# Patient Record
Sex: Male | Born: 1965 | Race: White | Hispanic: No | Marital: Married | Smoking: Never smoker
Health system: Southern US, Community
[De-identification: ages and names within clinical notes are randomized; demographics above are authoritative.]

## PROBLEM LIST (undated history)

## (undated) DIAGNOSIS — R040 Epistaxis: Secondary | ICD-10-CM

## (undated) DIAGNOSIS — Z87442 Personal history of urinary calculi: Secondary | ICD-10-CM

## (undated) DIAGNOSIS — N189 Chronic kidney disease, unspecified: Secondary | ICD-10-CM

## (undated) DIAGNOSIS — J3089 Other allergic rhinitis: Secondary | ICD-10-CM

## (undated) DIAGNOSIS — I10 Essential (primary) hypertension: Secondary | ICD-10-CM

## (undated) HISTORY — PX: APPENDECTOMY: SHX54

## (undated) HISTORY — PX: WISDOM TOOTH EXTRACTION: SHX21

---

## 2013-12-02 DIAGNOSIS — E785 Hyperlipidemia, unspecified: Secondary | ICD-10-CM | POA: Insufficient documentation

## 2013-12-02 DIAGNOSIS — Z8042 Family history of malignant neoplasm of prostate: Secondary | ICD-10-CM | POA: Insufficient documentation

## 2013-12-02 DIAGNOSIS — D229 Melanocytic nevi, unspecified: Secondary | ICD-10-CM | POA: Insufficient documentation

## 2014-12-29 DIAGNOSIS — R040 Epistaxis: Secondary | ICD-10-CM | POA: Insufficient documentation

## 2016-01-15 ENCOUNTER — Ambulatory Visit
Admission: EM | Admit: 2016-01-15 | Discharge: 2016-01-15 | Disposition: A | Discharge status: Home / Self Care | Payer: BC Managed Care – PPO | Attending: Emergency Medicine | Admitting: Emergency Medicine

## 2016-01-15 DIAGNOSIS — R319 Hematuria, unspecified: Secondary | ICD-10-CM

## 2016-01-15 DIAGNOSIS — J029 Acute pharyngitis, unspecified: Secondary | ICD-10-CM

## 2016-01-15 DIAGNOSIS — B349 Viral infection, unspecified: Secondary | ICD-10-CM | POA: Diagnosis not present

## 2016-01-15 DIAGNOSIS — R509 Fever, unspecified: Secondary | ICD-10-CM

## 2016-01-15 HISTORY — DX: Other allergic rhinitis: J30.89

## 2016-01-15 LAB — URINALYSIS COMPLETE WITH MICROSCOPIC (ARMC ONLY)
GLUCOSE, UA: NEGATIVE mg/dL
Ketones, ur: NEGATIVE mg/dL
LEUKOCYTES UA: NEGATIVE
Nitrite: NEGATIVE
PROTEIN: 30 mg/dL — AB
SPECIFIC GRAVITY, URINE: 1.025 (ref 1.005–1.030)
pH: 6 (ref 5.0–8.0)

## 2016-01-15 MED ORDER — SULFAMETHOXAZOLE-TRIMETHOPRIM 800-160 MG PO TABS: 1.0000 | ORAL_TABLET | Freq: Two times a day (BID) | ORAL | Status: DC

## 2016-01-15 NOTE — Discharge Instructions (Signed)
Push fluids, Pedialyte, Gatorade. Continue Goody's powders. Or You may take 1 g of Tylenol with 800 mg of ibuprofen up to 3 times a day. This is effective for fever and bodyaches. Don't do both Goody powder and Tylenol/ibuprofen mixture . Follow-up with primary care physician by next week to make sure that the blood in your urine has cleared. Go to the ER for the signs and symptoms we discussed

## 2016-01-15 NOTE — ED Provider Notes (Signed)
HPI  SUBJECTIVE:  Gregory Alexander is a 50 y.o. male who presents with fevers Tmax 102 for the past 2-3 days. He reports constant headaches, bodyaches, fatigue, decreased appetite. He reports a mild photophobia that has resolved with Goody's. He reports watery, nonbloody diarrhea 1-2 episodes per day, and dysuria. Symptoms are better with Goody's, which he states took 1 hour prior to arrival. No aggravating factors. He has also tried NyQuil and DayQuil. He denies nausea, vomiting, stiff neck, visual changes, rash, tick bite. No ear pain, nasal congestion, postnasal drip rhinorrhea, sinus pain or pressure, dental pain, sore throat, cough, wheeze, shortness of breath. No abdominal pain. No urinary urgency, frequency, cloudy or odorous urine, hematuria. No boils or skin infections. No aphasia, dysarthria, discoordination, arm or leg weakness. No raw uncooked foods, recent travel, questionable leftovers. He does state that he had this contact with similar symptoms. Patient states that he has not spent time outside in the woods recently. STDs are not a concern today. Patient states that overall he is getting better. Past medical history negative for STDs, UTI, pyelonephritis, diabetes, hypertension, immunocompromise, BPH, prostatitis, cancer, asthma, emphysema, COPD. He has a remote history of nephrolithiasis, hypercholesterolemia, headaches. He states that this is not the first or worst headache that he has ever had. PMD: [facility], [place]    Past Medical History  Diagnosis Date  . Environmental and seasonal allergies     Past Surgical History  Procedure Laterality Date  . Appendectomy      History reviewed. No pertinent family history.  Social History  Substance Use Topics  . Smoking status: Never Smoker   . Smokeless tobacco: None  . Alcohol Use: Yes     Comment: rare    No current facility-administered medications for this encounter.  Current outpatient prescriptions:  .   cetirizine (ZYRTEC) 10 MG chewable tablet, Chew 10 mg by mouth daily., Disp: , Rfl:  .  sulfamethoxazole-trimethoprim (BACTRIM DS,SEPTRA DS) 800-160 MG tablet, Take 1 tablet by mouth 2 (two) times daily., Disp: 6 tablet, Rfl: 0  No Known Allergies   ROS  As noted in HPI.   Physical Exam  BP 122/84 mmHg  Pulse 104  Temp(Src) 98.9 F (37.2 C) (Oral)  Resp 20  Ht 6\' 1"  (1.854 m)  Wt 230 lb (104.327 kg)  BMI 30.35 kg/m2  SpO2 96%  Constitutional: Well developed, well nourished, no acute distress Eyes: PERRL, EOMI, conjunctiva normal bilaterallyNo photophobia. HENT: Normocephalic, atraumatic,mucus membranes moist TMs normal. No nasal congestion. No sinus tenderness. Oropharynx normal. Neck: No cervical lymphadenopathy, meningismus Respiratory: Clear to auscultation bilaterally, no rales, no wheezing, no rhonchi Cardiovascular: Mild tachycardia, no murmurs, no gallops, no rubs GI: Soft, nondistended, normal bowel sounds, mild suprapubic tenderness no other abdominal tenderness, no rebound, no guarding Back: no CVAT skin: No rash on arms, hands, palms, torso, lower extremities, skin intact Musculoskeletal: No edema, no tenderness, no deformities Neurologic: Alert & oriented x 3, CN II-XII grossly intact, no motor deficits, sensation grossly intact. Speech fluent, gait normal. Psychiatric: Speech and behavior appropriate   ED Course   Medications - No data to display  Orders Placed This Encounter  Procedures  . Urine culture    Standing Status: Standing     Number of Occurrences: 1     Standing Expiration Date:     Order Specific Question:  Patient immune status    Answer:  Normal  . Urinalysis complete, with microscopic    Standing Status: Standing  Number of Occurrences: 1     Standing Expiration Date:    Results for orders placed or performed during the hospital encounter of 01/15/16 (from the past 24 hour(s))  Urinalysis complete, with microscopic     Status:  Abnormal   Collection Time: 01/15/16  2:18 PM  Result Value Ref Range   Color, Urine YELLOW YELLOW   APPearance CLEAR CLEAR   Glucose, UA NEGATIVE NEGATIVE mg/dL   Bilirubin Urine 1+ (A) NEGATIVE   Ketones, ur NEGATIVE NEGATIVE mg/dL   Specific Gravity, Urine 1.025 1.005 - 1.030   Hgb urine dipstick 2+ (A) NEGATIVE   pH 6.0 5.0 - 8.0   Protein, ur 30 (A) NEGATIVE mg/dL   Nitrite NEGATIVE NEGATIVE   Leukocytes, UA NEGATIVE NEGATIVE   RBC / HPF 21-50 0 - 5 RBC/hpf   WBC, UA 0-5 0 - 5 WBC/hpf   Bacteria, UA FEW (A) NONE SEEN   Squamous Epithelial / LPF 0-5 (A) NONE SEEN   Mucous PRESENT    Amorphous Crystal PRESENT    No results found.  ED Clinical Impression  Fever, unspecified fever cause  Viral syndrome  Hematuria   ED Assessment/Plan   Presentation consistent with a viral syndrome.  In The differential is meningitis, however, feel that meningitis is less likely given the body aches dysuria, and the diarrhea. he is also neurologically intact, alert, and is getting better, rather than getting worse. May also be tickborne illness, but patient does not recall any tick bite nor does he have a rash. Checking UA given the dysuria and suprapubic tenderness.   Patient with 2+ hemoglobin,.RBCs, few bacteria. Negative esterase and leukocytes, however, hematuria and given his symptoms of dysuria this may be from a UTI. We'll send this off for culture. It could also be nephrolithiasis, but patient does not have any back or abdominal pain. We'll send home with Bactrim for 3 days which will cover UTI in addition to an infectious diarrhea.   He'll follow-up with his primary care physician in several days. Showed patient what [facility] spotted fever and Lyme disease rash looks like. Gave patient very strict ER return precautions.    Discussed labs, imaging, MDM, plan and followup with patient. Discussed sn/sx that should prompt return to the  ED. Patient  agrees with plan.   *This  clinic note was created using Dragon dictation software. Therefore, there may be occasional mistakes despite careful proofreading.  ?  Melynda Ripple, MD 01/15/16 1530

## 2018-06-02 DIAGNOSIS — I10 Essential (primary) hypertension: Secondary | ICD-10-CM | POA: Insufficient documentation

## 2018-06-02 DIAGNOSIS — E669 Obesity, unspecified: Secondary | ICD-10-CM | POA: Insufficient documentation

## 2018-06-26 ENCOUNTER — Encounter: Payer: Self-pay | Admitting: Urology

## 2018-06-26 ENCOUNTER — Telehealth: Payer: Self-pay | Admitting: Urology

## 2018-06-26 ENCOUNTER — Ambulatory Visit (INDEPENDENT_AMBULATORY_CARE_PROVIDER_SITE_OTHER): Payer: BC Managed Care – PPO | Admitting: Urology

## 2018-06-26 ENCOUNTER — Other Ambulatory Visit
Admission: RE | Admit: 2018-06-26 | Discharge: 2018-06-26 | Disposition: A | Discharge status: Home / Self Care | Payer: BC Managed Care – PPO | Source: Ambulatory Visit | Attending: Urology | Admitting: Urology

## 2018-06-26 ENCOUNTER — Encounter (INDEPENDENT_AMBULATORY_CARE_PROVIDER_SITE_OTHER): Payer: Self-pay

## 2018-06-26 ENCOUNTER — Other Ambulatory Visit: Payer: Self-pay

## 2018-06-26 VITALS — BP 130/90 | HR 86 | Ht 73.0 in | Wt 230.0 lb

## 2018-06-26 DIAGNOSIS — R339 Retention of urine, unspecified: Secondary | ICD-10-CM | POA: Diagnosis not present

## 2018-06-26 DIAGNOSIS — N400 Enlarged prostate without lower urinary tract symptoms: Secondary | ICD-10-CM

## 2018-06-26 DIAGNOSIS — J302 Other seasonal allergic rhinitis: Secondary | ICD-10-CM | POA: Insufficient documentation

## 2018-06-26 DIAGNOSIS — N401 Enlarged prostate with lower urinary tract symptoms: Secondary | ICD-10-CM | POA: Diagnosis not present

## 2018-06-26 DIAGNOSIS — N1330 Unspecified hydronephrosis: Secondary | ICD-10-CM

## 2018-06-26 DIAGNOSIS — N179 Acute kidney failure, unspecified: Secondary | ICD-10-CM | POA: Diagnosis not present

## 2018-06-26 DIAGNOSIS — N138 Other obstructive and reflux uropathy: Secondary | ICD-10-CM

## 2018-06-26 LAB — URINALYSIS, COMPLETE (UACMP) WITH MICROSCOPIC
Bacteria, UA: NONE SEEN
Bilirubin Urine: NEGATIVE
Glucose, UA: NEGATIVE mg/dL
Ketones, ur: NEGATIVE mg/dL
LEUKOCYTES UA: NEGATIVE
NITRITE: NEGATIVE
Protein, ur: NEGATIVE mg/dL
SQUAMOUS EPITHELIAL / LPF: NONE SEEN (ref 0–5)
Specific Gravity, Urine: 1.015 (ref 1.005–1.030)
pH: 6.5 (ref 5.0–8.0)

## 2018-06-26 LAB — BASIC METABOLIC PANEL
ANION GAP: 8 (ref 5–15)
BUN: 20 mg/dL (ref 6–20)
CALCIUM: 9.8 mg/dL (ref 8.9–10.3)
CO2: 27 mmol/L (ref 22–32)
Chloride: 101 mmol/L (ref 98–111)
Creatinine, Ser: 1.62 mg/dL — ABNORMAL HIGH (ref 0.61–1.24)
GFR calc Af Amer: 55 mL/min — ABNORMAL LOW (ref >60)
GFR, EST NON AFRICAN AMERICAN: 48 mL/min — AB (ref >60)
Glucose, Bld: 109 mg/dL — ABNORMAL HIGH (ref 70–99)
POTASSIUM: 3.8 mmol/L (ref 3.5–5.1)
SODIUM: 136 mmol/L (ref 135–145)

## 2018-06-26 LAB — BLADDER SCAN AMB NON-IMAGING

## 2018-06-26 NOTE — Telephone Encounter (Signed)
email sent to scheduling   Gregory Alexander

## 2018-06-26 NOTE — Progress Notes (Signed)
Simple Catheter Placement  Due to urinary retention patient is present today for a foley cath placement.  Patient was cleaned and prepped in a sterile fashion with betadine and lidocaine jelly 2% was instilled into the urethra.  A 16 coude FR foley catheter was inserted, urine return was noted  2060ml, urine was yellow in color.  The balloon was filled with 10cc of sterile water.  A leg bag was attached for drainage. Patient was also given a night bag to take home and was given instruction on how to change from one bag to another.  Patient was given instruction on proper catheter care.  Patient tolerated well, no complications were noted   Preformed by: Fonnie Jarvis, CMA  Additional notes/ Follow up: next week for voiding trial [facility] teaching , BMP today

## 2018-06-26 NOTE — Telephone Encounter (Signed)
-----   Message from Hollice Espy, MD sent at 06/26/2018  2:54 PM EST ----- Regarding: RUS early next week I would like this patient to get a renal ultrasound early next week.  The order was placed.  Hollice Espy, MD

## 2018-06-26 NOTE — Progress Notes (Signed)
06/26/2018 2:07 PM   [name] [birth year] [id number]  Referring provider: Danae Orleans, MD [address] [place], [place] [address]  Chief Complaint  Patient presents with  . Benign Prostatic Hypertrophy    New Patient    HPI: 52 year old male referred from his primary care physician for chronic urinary symptoms which have progressively worsened over the last few months, acute kidney injury, hydronephrosis and urinary retention.  He reports that he has been having urinary symptoms starting several years ago primarily urinary urgency and frequency.   He reports that over the past several months, symptoms have started to to progressively worsen with the addition of obstructive urinary symptoms including difficulty emptying his bladder, straining to urinate, and sensation of incomplete emptying.  In addition to this, in the last 6 weeks, he has overlflow incontinence at nighttime waking up wet.  He started using say palmetto several years ago which helped for a while which continued to worsen.    About a month ago, he was seen and evaluated by his primary care physician.  This is primarily due to being sent to his PCP for hypertension which precluded some dental work.  He mentioned his progressively worsening urinary symptoms.  At that time, labs were checked and he was noted to have a rising creatinine up to 1.59 on 06/02/2018 which appears to have risen steadily from his baseline of 0.98 on 02/2017 on serial labs.  Renal ultrasound was ordered which showed a PVR of 1100 cc, bilateral moderate to severe hydronephrosis on the left and moderate hydronephrosis on the right on 06/12/2018.  At that point, he was referred to urology.  He was also prescribed cipro bid x 2 weeks without a uine culture.  Flomax was also started.  Most recent PSA 1.57 on 02/2018  Prostate volume 84 cc renal ultrasound.    IPSS    Row Name 06/26/18 1300         International Prostate Symptom Score   How often have you had the sensation of not emptying your bladder?  Almost always     How often have you had to urinate less than every two hours?  Almost always     How often have you found you stopped and started again several times when you urinated?  Almost always     How often have you found it difficult to postpone urination?  Almost always     How often have you had a weak urinary stream?  Almost always     How often have you had to strain to start urination?  Less than half the time     How many times did you typically get up at night to urinate?  5 Times     Total IPSS Score  32       Quality of Life due to urinary symptoms   If you were to spend the rest of your life with your urinary condition just the way it is now how would you feel about that?  Unhappy        Score:  1-7 Mild 8-19 Moderate 20-35 Severe    PMH: Past Medical History:  Diagnosis Date  . Environmental and seasonal allergies     Surgical History: Past Surgical History:  Procedure Laterality Date  . APPENDECTOMY      Home Medications:  Allergies as of 06/26/2018   No Known Allergies     Medication List        Accurate as of  06/26/18 11:59 PM. Always use your most recent med list.          amLODipine 2.5 MG tablet Commonly known as:  NORVASC Take by mouth.   atorvastatin 40 MG tablet Commonly known as:  LIPITOR   ciprofloxacin 500 MG tablet Commonly known as:  CIPRO TK 1 T PO BID FOR 14 DAYS   fluticasone 27.5 MCG/SPRAY nasal spray Commonly known as:  VERAMYST Place into the nose.   MULTI-VITAMINS Tabs Take by mouth.   Saw Palmetto 500 MG Caps Take by mouth.   tamsulosin 0.4 MG Caps capsule Commonly known as:  FLOMAX   UNABLE TO FIND Med Name: Testosterone Boost Supplement       Allergies: No Known Allergies  Family History: Family History  Problem Relation Age of Onset  . Prostate cancer Father   . Kidney cancer Neg Hx     Social History:  reports that he  has never smoked. He has never used smokeless tobacco. He reports that he drinks alcohol. He reports that he does not use drugs.  ROS: UROLOGY Frequent Urination?: Yes Hard to postpone urination?: Yes Burning/pain with urination?: Yes Get up at night to urinate?: Yes Leakage of urine?: Yes Urine stream starts and stops?: Yes Trouble starting stream?: Yes Do you have to strain to urinate?: Yes Blood in urine?: No Urinary tract infection?: Yes Sexually transmitted disease?: No Injury to kidneys or bladder?: No Painful intercourse?: No Weak stream?: Yes Erection problems?: Yes Penile pain?: No  Gastrointestinal Nausea?: No Vomiting?: No Indigestion/heartburn?: No Diarrhea?: No Constipation?: No  Constitutional Fever: No Night sweats?: No Weight loss?: No Fatigue?: No  Skin Skin rash/lesions?: No Itching?: No  Eyes Blurred vision?: No Double vision?: No  Ears/Nose/Throat Sore throat?: No Sinus problems?: No  Hematologic/Lymphatic Swollen glands?: No Easy bruising?: No  Cardiovascular Leg swelling?: No Chest pain?: No  Respiratory Cough?: No  Endocrine Excessive thirst?: No  Musculoskeletal Back pain?: Yes Joint pain?: No  Neurological Headaches?: No Dizziness?: No  Psychologic Depression?: No Anxiety?: No  Physical Exam: BP 130/90   Pulse 86   Ht 6\' 1"  (1.854 m)   Wt 230 lb (104.3 kg)   BMI 30.34 kg/m   Constitutional:  Alert and oriented, No acute distress. HEENT: [place] AT, moist mucus membranes.  Trachea midline, no masses. Cardiovascular: No clubbing, cyanosis, or edema. Respiratory: Normal respiratory effort, no increased work of breathing. GI: Abdomen is soft, nontender, nondistended, no abdominal masses GU: Circumcised phallus with bilateral descended testicles. Rectal: Normal sphincter tone.  Significant prostamegaly with rubbery lateral lobes, no nodules, nontender. Skin: No rashes, bruises or suspicious lesions. Neurologic:  Grossly intact, no focal deficits, moving all 4 extremities. Psychiatric: Normal mood and affect.  Laboratory Data: Results for orders placed or performed during the hospital encounter of 06/26/18  Urinalysis, Complete w Microscopic  Result Value Ref Range   Color, Urine YELLOW YELLOW   APPearance CLEAR CLEAR   Specific Gravity, Urine 1.015 1.005 - 1.030   pH 6.5 5.0 - 8.0   Glucose, UA NEGATIVE NEGATIVE mg/dL   Hgb urine dipstick TRACE (A) NEGATIVE   Bilirubin Urine NEGATIVE NEGATIVE   Ketones, ur NEGATIVE NEGATIVE mg/dL   Protein, ur NEGATIVE NEGATIVE mg/dL   Nitrite NEGATIVE NEGATIVE   Leukocytes, UA NEGATIVE NEGATIVE   Squamous Epithelial / LPF NONE SEEN 0 - 5   WBC, UA 0-5 0 - 5 WBC/hpf   RBC / HPF 0-5 0 - 5 RBC/hpf   Bacteria, UA NONE SEEN  NONE SEEN    Pertinent Imaging: Result Impression  - Bilateral hydronephrosis, moderate to severe on the left and moderate on the right, with diffusely dilated left ureter to the level of the bladder and mildly dilated partially visualized proximal right ureter.  - Distended urinary bladder with large postvoid residual which may be secondary to outlet obstruction in the setting of prostatomegaly. - Minimally complex 1.2 cm right renal cyst.  Please see below for data measurements:  Right kidney: 11.2 cm Left kidney: 10.7 cm Bladder volume: 1455.9 mL   Result Narrative  EXAM: US RENAL COMPLETE DATE: 06/12/2018 9:03 AM ACCESSION: [id number] UN DICTATED: 06/12/2018 9:05 AM INTERPRETATION LOCATION: [place]: 52 years old Male with new onset HTN and rise in creatinine level - I10 - Benign essential HTN - R79.89 - Increase in creatinine   COMPARISON: No prior available.  TECHNIQUE: Static and cine images of the kidneys and bladder were performed.  FINDINGS:  KIDNEYS: Normal size and echogenicity. No solid masses or calculi. Bilateral hydronephrosis, moderate to severe on the left and moderate on the  right, without significant improvement on the postvoid images. There is mild right proximal ureteral dilatation with gradual tapering noted. Dilatation of the left ureter is noted, extending to the level of the bladder. No obstructing stone or mass lesion was identified.   Incidentally noted anechoic cystic lesion within the interpolar region of the right kidney measuring 0.8 x 1.2 x 1.0 cm with a thin peripheral internal septation without definite vascularity.  BLADDER: Distended with urine, with large postvoid residual calculated as 1100 cc. Right-sided ureteral jet noted.   Protrusion of the posterior bladder wall secondary to a mildly enlarged prostate, with a calculated volume of 84 cc.    Bladder scan in our office today shows more than a liter in his bladder.  As per MA note, a coud catheter was placed in the proximal 200 cc of clear yellow urine was drained.  Assessment & Plan: 52 year old male with massive urinary retention, bilateral hydroureteronephrosis secondary to outlet obstruction which appears to be somewhat chronic.  This is likely secondary to prostamegaly, 81 g prostate.  Minimal concern for prostate cancer given his previously low PSA and reassuring rectal exam today.  1. Benign prostatic hyperplasia with urinary obstruction Continue flomax Foley catheter placed today VT in 1 weeks with [facility] teaching as I anticipate that he will not pass his voiding trial We discussed given the severity of his presentation, the unlikely that the addition of finasteride will make any sort of meaningful benefit in the short-term He will likely need an outlet procedure, likely HoLEP based on the volume of his prostate We discussed the role of urodynamics, at this point the role is somewhat limited because even if results are equivocal for obstruction, would likely pursue an outlet procedure given his relatively young age and good health in order to optimize his chances of voiding to completeness  and independently He will return to the office for voiding trial as outlined above and then to see me for cystoscopy to assess his anatomy and surgical planning Discussion today was lengthy, all questions answered, he is wife understand the timeline, severity, and possible interventions needed in the future - BLADDER SCAN AMB NON-IMAGING - US RENAL; Future - Basic metabolic panel; Future  2. Urinary retention Foley catheter today as above - Basic metabolic panel; Future  3. Bilateral hydronephrosis Secondary to #1 resulting in #4  4. Acute kidney injury ([place]) Secondary to #2  Renal ultrasound early next week to assess for resolution of his hydronephrosis with Foley catheter in place Repeat labs next week at the time of voiding trial STAT labs today to ensure that he has no significantly worsened creatinine or hyperkalemia Strict precautions today were reviewed for postobstructive diuresis including guidelines for presenting to the emergency room should he continue to make large volumes of urine after catheter placed Encouraged to rehydrate and replete electrolytes  Return for nurse visit VT/ [facility] next week and BMP(Friday AM), f/u 2-3 weeks cysto.  Hollice Espy, MD  [facility] [address], [place], [place] [address] [phone]  I spent 60 min with this patient of which greater than 50% was spent in counseling and coordination of care with the patient.

## 2018-06-29 ENCOUNTER — Telehealth: Payer: Self-pay

## 2018-06-29 NOTE — Telephone Encounter (Signed)
Spoke with patient following up on call nurse message from the weekend. Patient states he is doing better today. He and his wife were concerned about blood seen in catheter. This was discussed with patient in detail and he understands that he may see blood and possibly clots if his catheter stops draining he will contact the office for irrigation.

## 2018-06-30 ENCOUNTER — Ambulatory Visit
Admission: RE | Admit: 2018-06-30 | Discharge: 2018-06-30 | Disposition: A | Discharge status: Home / Self Care | Payer: BC Managed Care – PPO | Source: Ambulatory Visit | Attending: Urology | Admitting: Urology

## 2018-06-30 DIAGNOSIS — N138 Other obstructive and reflux uropathy: Secondary | ICD-10-CM | POA: Insufficient documentation

## 2018-06-30 DIAGNOSIS — N281 Cyst of kidney, acquired: Secondary | ICD-10-CM | POA: Insufficient documentation

## 2018-06-30 DIAGNOSIS — N289 Disorder of kidney and ureter, unspecified: Secondary | ICD-10-CM | POA: Diagnosis not present

## 2018-06-30 DIAGNOSIS — N1330 Unspecified hydronephrosis: Secondary | ICD-10-CM | POA: Diagnosis present

## 2018-06-30 DIAGNOSIS — N401 Enlarged prostate with lower urinary tract symptoms: Secondary | ICD-10-CM

## 2018-07-01 ENCOUNTER — Telehealth: Payer: Self-pay | Admitting: Urology

## 2018-07-01 DIAGNOSIS — R339 Retention of urine, unspecified: Secondary | ICD-10-CM

## 2018-07-01 NOTE — Telephone Encounter (Signed)
Pt had ultrasound and wanted to follow up on results,has catheter and is in constant pain per wife, wants to know if there is anything he can do/take to help with pain. Please advise.

## 2018-07-02 NOTE — Telephone Encounter (Signed)
-----   Message from Hollice Espy, MD sent at 07/02/2018 11:45 AM EST ----- Your right kidney is now decompressed without any further swelling.  Your left kidney however remains swollen, moderately.  This is somewhat concerning.  We will plan on rechecking your labs tomorrow we will remove your catheter and if your creatinine has improved back to normal, we may consider getting a CT scan to further assess.  Hollice Espy, MD

## 2018-07-02 NOTE — Telephone Encounter (Signed)
Patient's wife notified and lab order placed

## 2018-07-02 NOTE — Telephone Encounter (Signed)
Pt wife lmom this morning stating she called yesterday and hasn't had a return call, pt is in a lot of pain, wife states " orajel " isn't helping at all. Ms. Marlowe Sax asks that someone please call her about her husbands pain, she can be reached at [phone]. Thank you.

## 2018-07-03 ENCOUNTER — Ambulatory Visit (INDEPENDENT_AMBULATORY_CARE_PROVIDER_SITE_OTHER): Payer: BC Managed Care – PPO | Admitting: Urology

## 2018-07-03 ENCOUNTER — Encounter: Payer: Self-pay | Admitting: Urology

## 2018-07-03 ENCOUNTER — Other Ambulatory Visit
Admission: RE | Admit: 2018-07-03 | Discharge: 2018-07-03 | Disposition: A | Discharge status: Home / Self Care | Payer: BC Managed Care – PPO | Source: Ambulatory Visit | Attending: Urology | Admitting: Urology

## 2018-07-03 VITALS — BP 153/87 | HR 90

## 2018-07-03 DIAGNOSIS — R339 Retention of urine, unspecified: Secondary | ICD-10-CM | POA: Diagnosis not present

## 2018-07-03 LAB — BASIC METABOLIC PANEL
ANION GAP: 9 (ref 5–15)
BUN: 25 mg/dL — ABNORMAL HIGH (ref 6–20)
CO2: 22 mmol/L (ref 22–32)
Calcium: 9.2 mg/dL (ref 8.9–10.3)
Chloride: 106 mmol/L (ref 98–111)
Creatinine, Ser: 1.45 mg/dL — ABNORMAL HIGH (ref 0.61–1.24)
GFR calc Af Amer: >60 mL/min (ref >60)
GFR calc non Af Amer: 54 mL/min — ABNORMAL LOW (ref >60)
GLUCOSE: 123 mg/dL — AB (ref 70–99)
POTASSIUM: 4.5 mmol/L (ref 3.5–5.1)
Sodium: 137 mmol/L (ref 135–145)

## 2018-07-03 NOTE — Progress Notes (Signed)
Catheter Removal  Patient is present today for a catheter removal.  77ml of water was drained from the balloon. A 16 coudeFR foley cath was removed from the bladder no complications were noted . Patient tolerated well.  Preformed by: Fonnie Jarvis, CMA  Continuous Intermittent Catheterization  Due to urinary retention patient is present today for a teaching of self I & O Catheterization. Patient was given detailed verbal and printed instructions of self catheterization. Patient was cleaned and prepped in a sterile fashion.  With instruction and assistance patient inserted a 14FR flex coude due to BPH with Obstruction and urine return was noted 10 ml, urine was yellow in color. Patient tolerated well, complications were noted as: burning during cath insertion Patient was given a sample bag with supplies to take home.  Instructions were given per Dr. Erlene Quan for patient to cath 2 times daily if able to void , he is to keep a diary of residuals and if under 579ml he may stay at morning and night if above 520ml will need to increase amount per day. It was explained to the patient the importance to keep bladder decompressed. BMP results were given today per Dr. Erlene Quan, kidney funtion still elevated.   An order was placed with [facility] for catheters to be sent to the patient's home. Patient is to follow up in 2 weeks with Dr. Erlene Quan with labs prior to recheck kidney funtion  Preformed by: Fonnie Jarvis, CMA  Additional Notes: Patient's wife was also present at today's visit and instructions were gone over in detail with her to help patient if needed.

## 2018-07-05 ENCOUNTER — Encounter: Payer: Self-pay | Admitting: Urology

## 2018-07-10 ENCOUNTER — Encounter: Payer: Self-pay | Admitting: Urology

## 2018-07-12 DIAGNOSIS — R040 Epistaxis: Secondary | ICD-10-CM

## 2018-07-12 HISTORY — DX: Epistaxis: R04.0

## 2018-07-22 NOTE — Progress Notes (Signed)
   07/23/2018  CC:  Chief Complaint  Patient presents with  . Procedure    Cystoscopy   HPI: Gregory Alexander is a 52 yo M with a history of benign prostatic hyperplasia with urinary obstruction, urinary retention, bilateral hydronephrosis, and acute kidney injury returns today for a cystoscopy.    He reports of resistance when cathing and he caths 4x a day. His UA has 11-30 RBC and few bacteria.   Today's Vitals   07/23/18 1016  BP: (!) 152/94  Pulse: (!) 102  Weight: 231 lb (104.8 kg)   Body mass index is 30.48 kg/m. NED. A&Ox3.   No respiratory distress   Abd soft, NT, ND Normal phallus with bilateral descended testicles  Cystoscopy Procedure Note  Patient identification was confirmed, informed consent was obtained, and patient was prepped using Betadine solution.  Lidocaine jelly was administered per urethral meatus.    Pre-Procedure: - Inspection reveals a normal caliber ureteral meatus.  Procedure: The flexible cystoscope was introduced without difficulty - No urethral strictures/lesions are present. - Normal prostate enlarged medium low component to it, 6 cm prostatic length - Tight bladder neck - Bilateral ureteral orifices identified - Bladder mucosa  reveals no tumors, or lesions - little ulcer at sphincter  - trilobar coaptation  - No bladder stones - heavily trabeculation with multiple diverticula diffusing out -trauma of urethra where catheter is inserted  Retroflexion shows abnormal bladder.   Post-Procedure: - Patient tolerated the procedure well  Assessment/ Plan:  1. BPH with urinary obstruction Continue flomax  Foley catheter replaced today, unable to tolerate CIC Discussion today was lengthy, all questions answered, he is wife understand the timeline, severity, and possible interventions needed in the future  2. Urinary retention Foley catheter today as above  We discussed alternatives including TURP vs. holmium laser enucleation of the  prostate vs. greenlight laser ablation. Differences between the surgical procedures were discussed as well as the risks and benefits of each. He is most interested in HoLEP.  We discussed the common postoperative course following holep including need for overnight Foley catheter, temporary worsening of irritative voiding symptoms, and occasional stress incontinence which typically lasts up to 6 months but can persist. We discussed retrograde ejaculation and damage to surrounding structures including the urinary sphincter. Other uncommon complications including hematuria and urinary tract infection.  He also understands that given the severity of his obstruction, he may have a component of atonic bladder this his urinary retention may or may not resolve.  He may continue to CIC after surgery.  He understands all of the above and is willing to proceed as planned.urinary retention   3. Persistent left sided hydronephrosis  Plan for repeat RUS to assess for continued improvement/resolution to surgery Consider CT Urogram pending his repeat BMP  Retrograde will be difficult in OR due to difficulty visualizing trigone/UOs  4. Acute kidney injury  Secondary to #2 Foley catheter in place   Schedule holmium laser enucleation of the prostate  Hollice Espy, MD  I, Lucas Mallow, am acting as a scribe for Dr. Hollice Espy,  I have reviewed the above documentation for accuracy and completeness, and I agree with the above.   Hollice Espy, MD

## 2018-07-22 NOTE — H&P (View-Only) (Signed)
   07/23/2018  CC:  Chief Complaint  Patient presents with  . Procedure    Cystoscopy   HPI: Gregory Alexander is a 52 yo M with a history of benign prostatic hyperplasia with urinary obstruction, urinary retention, bilateral hydronephrosis, and acute kidney injury returns today for a cystoscopy.    He reports of resistance when cathing and he caths 4x a day. His UA has 11-30 RBC and few bacteria.   Today's Vitals   07/23/18 1016  BP: (!) 152/94  Pulse: (!) 102  Weight: 231 lb (104.8 kg)   Body mass index is 30.48 kg/m. NED. A&Ox3.   No respiratory distress   Abd soft, NT, ND Normal phallus with bilateral descended testicles  Cystoscopy Procedure Note  Patient identification was confirmed, informed consent was obtained, and patient was prepped using Betadine solution.  Lidocaine jelly was administered per urethral meatus.    Pre-Procedure: - Inspection reveals a normal caliber ureteral meatus.  Procedure: The flexible cystoscope was introduced without difficulty - No urethral strictures/lesions are present. - Normal prostate enlarged medium low component to it, 6 cm prostatic length - Tight bladder neck - Bilateral ureteral orifices identified - Bladder mucosa  reveals no tumors, or lesions - little ulcer at sphincter  - trilobar coaptation  - No bladder stones - heavily trabeculation with multiple diverticula diffusing out -trauma of urethra where catheter is inserted  Retroflexion shows abnormal bladder.   Post-Procedure: - Patient tolerated the procedure well  Assessment/ Plan:  1. BPH with urinary obstruction Continue flomax  Foley catheter replaced today, unable to tolerate CIC Discussion today was lengthy, all questions answered, he is wife understand the timeline, severity, and possible interventions needed in the future  2. Urinary retention Foley catheter today as above  We discussed alternatives including TURP vs. holmium laser enucleation of the  prostate vs. greenlight laser ablation. Differences between the surgical procedures were discussed as well as the risks and benefits of each. He is most interested in HoLEP.  We discussed the common postoperative course following holep including need for overnight Foley catheter, temporary worsening of irritative voiding symptoms, and occasional stress incontinence which typically lasts up to 6 months but can persist. We discussed retrograde ejaculation and damage to surrounding structures including the urinary sphincter. Other uncommon complications including hematuria and urinary tract infection.  He also understands that given the severity of his obstruction, he may have a component of atonic bladder this his urinary retention may or may not resolve.  He may continue to CIC after surgery.  He understands all of the above and is willing to proceed as planned.urinary retention   3. Persistent left sided hydronephrosis  Plan for repeat RUS to assess for continued improvement/resolution to surgery Consider CT Urogram pending his repeat BMP  Retrograde will be difficult in OR due to difficulty visualizing trigone/UOs  4. Acute kidney injury  Secondary to #2 Foley catheter in place   Schedule holmium laser enucleation of the prostate  Hollice Espy, MD  I, Lucas Mallow, am acting as a scribe for Dr. Hollice Espy,  I have reviewed the above documentation for accuracy and completeness, and I agree with the above.   Hollice Espy, MD

## 2018-07-23 ENCOUNTER — Other Ambulatory Visit: Payer: Self-pay | Admitting: Radiology

## 2018-07-23 ENCOUNTER — Ambulatory Visit: Payer: BC Managed Care – PPO | Admitting: Urology

## 2018-07-23 ENCOUNTER — Encounter: Payer: Self-pay | Admitting: Urology

## 2018-07-23 ENCOUNTER — Telehealth: Payer: Self-pay | Admitting: Radiology

## 2018-07-23 VITALS — BP 152/94 | HR 102 | Wt 231.0 lb

## 2018-07-23 DIAGNOSIS — N179 Acute kidney failure, unspecified: Secondary | ICD-10-CM

## 2018-07-23 DIAGNOSIS — R339 Retention of urine, unspecified: Secondary | ICD-10-CM

## 2018-07-23 DIAGNOSIS — N138 Other obstructive and reflux uropathy: Secondary | ICD-10-CM

## 2018-07-23 DIAGNOSIS — N1330 Unspecified hydronephrosis: Secondary | ICD-10-CM

## 2018-07-23 DIAGNOSIS — N401 Enlarged prostate with lower urinary tract symptoms: Principal | ICD-10-CM

## 2018-07-23 LAB — URINALYSIS, COMPLETE
BILIRUBIN UA: NEGATIVE
Glucose, UA: NEGATIVE
KETONES UA: NEGATIVE
Leukocytes, UA: NEGATIVE
NITRITE UA: NEGATIVE
PH UA: 7 (ref 5.0–7.5)
PROTEIN UA: NEGATIVE
Specific Gravity, UA: 1.015 (ref 1.005–1.030)
Urobilinogen, Ur: 0.2 mg/dL (ref 0.2–1.0)

## 2018-07-23 LAB — MICROSCOPIC EXAMINATION: Epithelial Cells (non renal): NONE SEEN /hpf (ref 0–10)

## 2018-07-23 NOTE — Telephone Encounter (Signed)
Patient was given the [facility] Information form below as well as the Instructions for Pre-Admission Testing form & a map of [facility].   [address], [address] [place], [place] [address] Telephone: [phone] Fax: [phone]   Thank you for choosing [facility] for your upcoming surgery!  We are always here to assist in your urological needs.  Please read the following information with specific details for your upcoming appointments related to your surgery. Please contact Shat[facility] Crist at [phone] Option 3 with any questions.  The Name of Your Surgery: Holmium laser enucleation of prostate Your Surgery Date: 08/17/2018 Your Surgeon: Hollice Espy  Please call Same Day Surgery at [phone] between the hours of 1pm-3pm one day prior to your surgery. They will inform you of the time to arrive at Same Day Surgery which is located on the second floor of the [facility].   Please refer to the attached letter regarding instructions for Pre-Admission Testing. You will receive a call from the [place] office regarding your appointment with them.  The Pre-Admission Testing office is located at [place], on the first floor of the [place] [facility] in [place] (office is to the right as you enter through the [employer] of the [facility]). Please have all medications you are currently taking and your insurance card available.   Patient was advised to have nothing to eat or drink after midnight the night prior to surgery except that he may have only water until 2 hours before surgery with nothing to drink within 2 hours of surgery.  The patient states he currently takes no blood thinners. Patient's questions were answered and he expressed understanding of these instructions.

## 2018-07-24 ENCOUNTER — Telehealth: Payer: Self-pay | Admitting: Urology

## 2018-07-24 ENCOUNTER — Other Ambulatory Visit: Payer: Self-pay | Admitting: Radiology

## 2018-07-24 DIAGNOSIS — N1330 Unspecified hydronephrosis: Secondary | ICD-10-CM

## 2018-07-24 LAB — BASIC METABOLIC PANEL
BUN / CREAT RATIO: 12 (ref 9–20)
BUN: 18 mg/dL (ref 6–24)
CALCIUM: 10.7 mg/dL — AB (ref 8.7–10.2)
CO2: 26 mmol/L (ref 20–29)
Chloride: 100 mmol/L (ref 96–106)
Creatinine, Ser: 1.52 mg/dL — ABNORMAL HIGH (ref 0.76–1.27)
GFR calc Af Amer: 60 mL/min/{1.73_m2} (ref >59)
GFR, EST NON AFRICAN AMERICAN: 52 mL/min/{1.73_m2} — AB (ref >59)
Glucose: 125 mg/dL — ABNORMAL HIGH (ref 65–99)
POTASSIUM: 4.6 mmol/L (ref 3.5–5.2)
Sodium: 142 mmol/L (ref 134–144)

## 2018-07-24 NOTE — Telephone Encounter (Signed)
LMOM to notify patient  Of preadmit appointment and decreased kidney funtion which will require ultrasound in two weeks.radiology scheduling will contact patient with appointment.

## 2018-07-24 NOTE — Telephone Encounter (Signed)
Your creatinine is up again to 1.5.  I think having a Foley catheter will help some.  Lets follow-up with renal ultrasound in about 2 weeks from now with a catheter in to assess for resolution of the left-sided hydronephrosis.  Hollice Espy, MD

## 2018-07-28 ENCOUNTER — Telehealth: Payer: Self-pay | Admitting: Radiology

## 2018-07-28 ENCOUNTER — Telehealth: Payer: Self-pay | Admitting: Family Medicine

## 2018-07-28 DIAGNOSIS — R339 Retention of urine, unspecified: Secondary | ICD-10-CM

## 2018-07-28 DIAGNOSIS — N138 Other obstructive and reflux uropathy: Secondary | ICD-10-CM

## 2018-07-28 DIAGNOSIS — N401 Enlarged prostate with lower urinary tract symptoms: Principal | ICD-10-CM

## 2018-07-28 LAB — CULTURE, URINE COMPREHENSIVE

## 2018-07-28 MED ORDER — SULFAMETHOXAZOLE-TRIMETHOPRIM 800-160 MG PO TABS: 1.0000 | ORAL_TABLET | Freq: Two times a day (BID) | ORAL | 0 refills | Status: DC

## 2018-07-28 NOTE — Telephone Encounter (Signed)
-----   Message from Hollice Espy, MD sent at 07/28/2018 12:05 PM EST ----- This urine culture was obtained for the purpose of preop.  It is likely contaminant but like to treat it prior to surgery.  Please prescribe him Bactrim DS twice daily for 7 days prior to surgery.     If he becomes symptomatic i.e. increased amount of bladder spasms or pain/fevers, he can go and take it.  Hollice Espy, MD

## 2018-07-28 NOTE — Telephone Encounter (Signed)
Notified patient of Dr Cherrie Gauze note below including urine culture result & script sent to pharmacy. Patient denies bladder spasms, pain & fever at this time. Advised patient to start medication 7 days prior to surgery (beginning 08/10/2018) or sooner if he becomes symptomatic. Questions answered. Patient expresses understanding of conversation.

## 2018-07-28 NOTE — Telephone Encounter (Signed)
LMOM for patient to return call. He call the nurse line and is having catheter problems.

## 2018-08-03 ENCOUNTER — Other Ambulatory Visit: Payer: Self-pay | Admitting: Radiology

## 2018-08-03 ENCOUNTER — Other Ambulatory Visit: Payer: Self-pay

## 2018-08-03 ENCOUNTER — Telehealth: Payer: Self-pay | Admitting: Radiology

## 2018-08-03 ENCOUNTER — Encounter
Admission: RE | Admit: 2018-08-03 | Discharge: 2018-08-03 | Disposition: A | Discharge status: Home / Self Care | Payer: BC Managed Care – PPO | Source: Ambulatory Visit | Attending: Urology | Admitting: Urology

## 2018-08-03 DIAGNOSIS — I10 Essential (primary) hypertension: Secondary | ICD-10-CM | POA: Insufficient documentation

## 2018-08-03 DIAGNOSIS — Z01818 Encounter for other preprocedural examination: Secondary | ICD-10-CM | POA: Insufficient documentation

## 2018-08-03 HISTORY — DX: Essential (primary) hypertension: I10

## 2018-08-03 HISTORY — DX: Epistaxis: R04.0

## 2018-08-03 HISTORY — DX: Personal history of urinary calculi: Z87.442

## 2018-08-03 HISTORY — DX: Chronic kidney disease, unspecified: N18.9

## 2018-08-03 LAB — BASIC METABOLIC PANEL
ANION GAP: 7 (ref 5–15)
BUN: 20 mg/dL (ref 6–20)
CO2: 25 mmol/L (ref 22–32)
Calcium: 9.2 mg/dL (ref 8.9–10.3)
Chloride: 103 mmol/L (ref 98–111)
Creatinine, Ser: 1.5 mg/dL — ABNORMAL HIGH (ref 0.61–1.24)
GFR calc Af Amer: >60 mL/min (ref >60)
GFR calc non Af Amer: 53 mL/min — ABNORMAL LOW (ref >60)
GLUCOSE: 96 mg/dL (ref 70–99)
Potassium: 3.8 mmol/L (ref 3.5–5.1)
Sodium: 135 mmol/L (ref 135–145)

## 2018-08-03 NOTE — Telephone Encounter (Signed)
Called patient to inquire about message below from pre-admission testing appointment. Patient states he started taking antibiotic prescribed by Dr Erlene Quan due to fever of 99.5-100.5, bladder spasms & cloudy urine on 08/01/2018. Patient reports symptoms have resolved & urine is now clear but dark. Reassured patient that dark urine is normal with a catheter. Advised patient to call if urine looks bright red, clots are present or catheter gets clogged. Urine culture will be repeated prior to surgery once antibiotics are completed.        Signed         Added by: [x] Cay Schillings, RN  Patient was seen in preadmit testing today.  States that he started antibiotics on December 21st, not the 17th.  He will return for a urinalysis and curine culture on December 30th, once he has completed his course of antibiotics.  Patient and wife kept expressing his discomfort from his foley and we spoke of a variety of ways to help this situation. He also was concerned as to how long his urine should appear tea colored.  It is clear but it is dark. Wife states she had him drink at least 8 8ounce glasses of liquids yesterday.  I will send a note to Dr. Erlene Quan regarding this question.        Electronically signed by Cay Schillings, RN at 08/03/2018 2:35 PM

## 2018-08-03 NOTE — Patient Instructions (Signed)
Your procedure is scheduled on: Monday August 17, 2018  Report to [facility]    DO NOT STOP ON THE FIRST FLOOR TO REGISTER  To find out your arrival time please call [phone] between 1PM - 3PM on Friday, January 3RD  Remember: Instructions that are not followed completely may result in serious medical risk,  up to and including death, or upon the discretion of your surgeon and anesthesiologist your  surgery may need to be rescheduled.     _X__ 1. Do not eat food after midnight the night before your procedure.                 No gum chewing or hard candies.                   ABSOLUTELY NOTHING SOLID IN YOUR MOUTH AFTER MIDNIGHT                  You may drink clear liquids up to 2 hours before you are scheduled to arrive for your surgery-                   DO not drink clear liquids within 2 hours of the start of your surgery.                  Clear Liquids include:  water, apple juice without pulp, clear carbohydrate                 drink such as Clearfast of Gatorade, Black Coffee or Tea (Do not add                 anything to coffee or tea).NO DAIRY PRODUCTS OR LEMON OR HONEY  __X__2.  On the morning of surgery brush your teeth with toothpaste and water,                    You may rinse your mouth with mouthwash if you wish.                       Do not swallow any toothpaste of mouthwash.     _X__ 3.  No Alcohol for 24 hours before or after surgery.   _X__ 4.  Do Not Smoke or use e-cigarettes For 24 Hours Prior to Your Surgery.                 Do not use any chewable tobacco products for at least 6 hours prior to                 surgery.  ____  5.  Bring all medications with you on the day of surgery if instructed.   _X___  6.  Notify your doctor if there is any change in your medical condition      (cold, fever, infections).     Do not wear jewelry, make-up, hairpins, clips or nail polish. Do not wear lotions, powders,  or perfumes. You may wear deodorant. Do not shave 48 hours prior to surgery. Men may shave face and neck. Do not bring valuables to the hospital.    [facility] is not responsible for any belongings or valuables.  Contacts, dentures or bridgework may not be worn into surgery. Leave your suitcase in the car. After surgery it may be brought to your room. For patients admitted to the hospital, discharge time is determined by your treatment team.  Patients discharged the day of surgery will not be allowed to drive home.   Please read over the following fact sheets that you were given:   [place]  __X__ Take these medicines the morning of surgery with A SIP OF WATER:    1. FLONASE  2.   3.   4.  5.  6.  ____ Fleet Enema (as directed)   __X__ TAKE A SHOWER ON THE MORNING OF SURGERY  ____ Use inhalers on the day of surgery  __X__ Stop ALL ASPIRIN PRODUCTS AS OF December 31ST.               THIS INCLUDES BC POWDERS / GOODIES POWDER  __X__ Stop Anti-inflammatories AS OF December 31ST                TYLENOL IS OKAY AT ANY TIME   ____ Stop supplements until after surgery.    ____ Bring C-Pap to the hospital.   [place]  IF YOU COMPLETE THE PAPERWORK FOR THE ADVANCE DIRECTIVE,       [place] WITH YOU TO [place].  STOOL SOFTENERS, ANY KIND. BEGIN THE DAY OR TWO BEFORE SURGERY  CONTINUE TAKING ALL YOUR PRESCRIPTION MEDICATIONS AS USUAL.   RETURN TO PRE ADMIT TESTING WITH URINE SAMPLE BY December 30TH OR 31ST.

## 2018-08-03 NOTE — Pre-Procedure Instructions (Signed)
Patient was seen in preadmit testing today.  States that he started antibiotics on December 21st, not the 17th.  He will return for a urinalysis and curine culture on December 30th, once he has completed his course of antibiotics.  Patient and wife kept expressing his discomfort from his foley and we spoke of a variety of ways to help this situation. He also was concerned as to how long his urine should appear tea colored.  It is clear but it is dark. Wife states she had him drink at least 8 8ounce glasses of liquids yesterday.  I will send a note to Dr. Erlene Quan regarding this question.

## 2018-08-10 ENCOUNTER — Encounter
Admission: RE | Admit: 2018-08-10 | Discharge: 2018-08-10 | Disposition: A | Discharge status: Home / Self Care | Payer: BC Managed Care – PPO | Source: Ambulatory Visit | Attending: Urology | Admitting: Urology

## 2018-08-10 ENCOUNTER — Ambulatory Visit
Admission: RE | Admit: 2018-08-10 | Discharge: 2018-08-10 | Disposition: A | Discharge status: Home / Self Care | Payer: BC Managed Care – PPO | Source: Ambulatory Visit | Attending: Urology | Admitting: Urology

## 2018-08-10 DIAGNOSIS — N1330 Unspecified hydronephrosis: Secondary | ICD-10-CM | POA: Diagnosis present

## 2018-08-10 LAB — URINALYSIS, ROUTINE W REFLEX MICROSCOPIC
Bacteria, UA: NONE SEEN
Bilirubin Urine: NEGATIVE
Glucose, UA: NEGATIVE mg/dL
Ketones, ur: NEGATIVE mg/dL
Leukocytes, UA: NEGATIVE
Nitrite: NEGATIVE
Protein, ur: NEGATIVE mg/dL
RBC / HPF: >50 RBC/hpf — ABNORMAL HIGH (ref 0–5)
Specific Gravity, Urine: 1.019 (ref 1.005–1.030)
Squamous Epithelial / HPF: NONE SEEN (ref 0–5)
pH: 6 (ref 5.0–8.0)

## 2018-08-11 LAB — URINE CULTURE: Culture: NO GROWTH

## 2018-08-11 NOTE — Pre-Procedure Instructions (Signed)
UA results sent to Dr. Erlene Quan for review.

## 2018-08-17 ENCOUNTER — Ambulatory Visit: Payer: BC Managed Care – PPO | Admitting: Anesthesiology

## 2018-08-17 ENCOUNTER — Ambulatory Visit
Admission: RE | Admit: 2018-08-17 | Discharge: 2018-08-17 | Disposition: A | Discharge status: Home / Self Care | Payer: BC Managed Care – PPO | Source: Ambulatory Visit | Attending: Urology | Admitting: Urology

## 2018-08-17 ENCOUNTER — Telehealth: Payer: Self-pay

## 2018-08-17 ENCOUNTER — Other Ambulatory Visit: Payer: Self-pay

## 2018-08-17 ENCOUNTER — Encounter: Payer: Self-pay | Admitting: *Deleted

## 2018-08-17 ENCOUNTER — Encounter
Admission: RE | Disposition: A | Discharge status: Home / Self Care | Payer: Self-pay | Source: Ambulatory Visit | Attending: Urology

## 2018-08-17 DIAGNOSIS — N138 Other obstructive and reflux uropathy: Secondary | ICD-10-CM

## 2018-08-17 DIAGNOSIS — R338 Other retention of urine: Secondary | ICD-10-CM | POA: Diagnosis not present

## 2018-08-17 DIAGNOSIS — R339 Retention of urine, unspecified: Secondary | ICD-10-CM

## 2018-08-17 DIAGNOSIS — Z87442 Personal history of urinary calculi: Secondary | ICD-10-CM | POA: Diagnosis not present

## 2018-08-17 DIAGNOSIS — N401 Enlarged prostate with lower urinary tract symptoms: Secondary | ICD-10-CM | POA: Insufficient documentation

## 2018-08-17 DIAGNOSIS — N1330 Unspecified hydronephrosis: Secondary | ICD-10-CM | POA: Diagnosis not present

## 2018-08-17 DIAGNOSIS — N179 Acute kidney failure, unspecified: Secondary | ICD-10-CM | POA: Diagnosis not present

## 2018-08-17 DIAGNOSIS — I10 Essential (primary) hypertension: Secondary | ICD-10-CM | POA: Diagnosis not present

## 2018-08-17 DIAGNOSIS — N320 Bladder-neck obstruction: Secondary | ICD-10-CM | POA: Diagnosis not present

## 2018-08-17 HISTORY — PX: HOLEP-LASER ENUCLEATION OF THE PROSTATE WITH MORCELLATION: SHX6641

## 2018-08-17 SURGERY — ENUCLEATION, PROSTATE, USING LASER, WITH MORCELLATION
Anesthesia: General | Site: Prostate

## 2018-08-17 MED ORDER — MIDAZOLAM HCL 2 MG/2ML IJ SOLN
INTRAMUSCULAR | Status: AC
  Filled 2018-08-17: qty 2

## 2018-08-17 MED ORDER — FENTANYL CITRATE (PF) 100 MCG/2ML IJ SOLN
INTRAMUSCULAR | Status: DC | PRN
  Administered 2018-08-17 (×2): 50 ug via INTRAVENOUS

## 2018-08-17 MED ORDER — FENTANYL CITRATE (PF) 100 MCG/2ML IJ SOLN
INTRAMUSCULAR | Status: AC
  Filled 2018-08-17: qty 2

## 2018-08-17 MED ORDER — FAMOTIDINE 20 MG PO TABS
20.0000 mg | ORAL_TABLET | Freq: Once | ORAL | Status: AC
  Administered 2018-08-17: 20 mg via ORAL

## 2018-08-17 MED ORDER — PROMETHAZINE HCL 25 MG/ML IJ SOLN
6.2500 mg | INTRAMUSCULAR | Status: AC | PRN
  Administered 2018-08-17 (×2): 6.25 mg via INTRAVENOUS

## 2018-08-17 MED ORDER — SODIUM CHLORIDE FLUSH 0.9 % IV SOLN
INTRAVENOUS | Status: AC
  Filled 2018-08-17: qty 10

## 2018-08-17 MED ORDER — HYDROCODONE-ACETAMINOPHEN 5-325 MG PO TABS: 1.0000 | ORAL_TABLET | Freq: Four times a day (QID) | ORAL | 0 refills | Status: DC | PRN

## 2018-08-17 MED ORDER — LIDOCAINE HCL (CARDIAC) PF 100 MG/5ML IV SOSY
PREFILLED_SYRINGE | INTRAVENOUS | Status: DC | PRN
  Administered 2018-08-17: 100 mg via INTRAVENOUS

## 2018-08-17 MED ORDER — FUROSEMIDE 10 MG/ML IJ SOLN
INTRAMUSCULAR | Status: DC | PRN
  Administered 2018-08-17: 10 mg via INTRAMUSCULAR

## 2018-08-17 MED ORDER — ALBUTEROL SULFATE HFA 108 (90 BASE) MCG/ACT IN AERS
INHALATION_SPRAY | RESPIRATORY_TRACT | Status: AC
  Filled 2018-08-17: qty 6.7

## 2018-08-17 MED ORDER — HYDROMORPHONE HCL 1 MG/ML IJ SOLN
0.2500 mg | INTRAMUSCULAR | Status: DC | PRN
  Administered 2018-08-17 (×4): 0.25 mg via INTRAVENOUS

## 2018-08-17 MED ORDER — CEFAZOLIN SODIUM-DEXTROSE 2-4 GM/100ML-% IV SOLN
INTRAVENOUS | Status: AC
  Filled 2018-08-17: qty 100

## 2018-08-17 MED ORDER — LACTATED RINGERS IV SOLN
INTRAVENOUS | Status: DC
  Administered 2018-08-17 (×3): via INTRAVENOUS

## 2018-08-17 MED ORDER — PROPOFOL 10 MG/ML IV BOLUS
INTRAVENOUS | Status: DC | PRN
  Administered 2018-08-17: 50 mg via INTRAVENOUS
  Administered 2018-08-17: 200 mg via INTRAVENOUS

## 2018-08-17 MED ORDER — ONDANSETRON HCL 4 MG/2ML IJ SOLN
INTRAMUSCULAR | Status: DC | PRN
  Administered 2018-08-17: 4 mg via INTRAVENOUS

## 2018-08-17 MED ORDER — SUGAMMADEX SODIUM 200 MG/2ML IV SOLN
INTRAVENOUS | Status: DC | PRN
  Administered 2018-08-17: 200 mg via INTRAVENOUS

## 2018-08-17 MED ORDER — DOCUSATE SODIUM 100 MG PO CAPS: 100.0000 mg | ORAL_CAPSULE | Freq: Two times a day (BID) | ORAL | 0 refills | Status: DC

## 2018-08-17 MED ORDER — DEXAMETHASONE SODIUM PHOSPHATE 10 MG/ML IJ SOLN
INTRAMUSCULAR | Status: DC | PRN
  Administered 2018-08-17: 4 mg via INTRAVENOUS

## 2018-08-17 MED ORDER — OXYBUTYNIN CHLORIDE 5 MG PO TABS: 5.0000 mg | ORAL_TABLET | Freq: Three times a day (TID) | ORAL | 0 refills | Status: DC | PRN

## 2018-08-17 MED ORDER — HYDROMORPHONE HCL 1 MG/ML IJ SOLN
INTRAMUSCULAR | Status: AC
  Administered 2018-08-17: 0.25 mg via INTRAVENOUS
  Filled 2018-08-17: qty 1

## 2018-08-17 MED ORDER — PROPOFOL 10 MG/ML IV BOLUS
INTRAVENOUS | Status: AC
  Filled 2018-08-17: qty 40

## 2018-08-17 MED ORDER — FAMOTIDINE 20 MG PO TABS
ORAL_TABLET | ORAL | Status: AC
  Filled 2018-08-17: qty 1

## 2018-08-17 MED ORDER — ROCURONIUM BROMIDE 100 MG/10ML IV SOLN
INTRAVENOUS | Status: DC | PRN
  Administered 2018-08-17: 10 mg via INTRAVENOUS
  Administered 2018-08-17: 50 mg via INTRAVENOUS

## 2018-08-17 MED ORDER — CEFAZOLIN SODIUM-DEXTROSE 2-4 GM/100ML-% IV SOLN
2.0000 g | INTRAVENOUS | Status: AC
  Administered 2018-08-17: 2 g via INTRAVENOUS

## 2018-08-17 MED ORDER — FUROSEMIDE 10 MG/ML IJ SOLN
INTRAMUSCULAR | Status: AC
  Filled 2018-08-17: qty 4

## 2018-08-17 MED ORDER — PROMETHAZINE HCL 25 MG/ML IJ SOLN
INTRAMUSCULAR | Status: AC
  Administered 2018-08-17: 6.25 mg via INTRAVENOUS
  Filled 2018-08-17: qty 1

## 2018-08-17 MED ORDER — EPHEDRINE SULFATE 50 MG/ML IJ SOLN
INTRAMUSCULAR | Status: DC | PRN
  Administered 2018-08-17: 50 mg via INTRAVENOUS

## 2018-08-17 MED ORDER — MIDAZOLAM HCL 2 MG/2ML IJ SOLN
INTRAMUSCULAR | Status: DC | PRN
  Administered 2018-08-17: 2 mg via INTRAVENOUS

## 2018-08-17 MED ORDER — PHENYLEPHRINE HCL 10 MG/ML IJ SOLN
INTRAMUSCULAR | Status: DC | PRN
  Administered 2018-08-17: 100 ug via INTRAVENOUS
  Administered 2018-08-17: 200 ug via INTRAVENOUS
  Administered 2018-08-17: 100 ug via INTRAVENOUS

## 2018-08-17 MED ORDER — ROCURONIUM BROMIDE 50 MG/5ML IV SOLN
INTRAVENOUS | Status: AC
  Filled 2018-08-17: qty 2

## 2018-08-17 SURGICAL SUPPLY — 33 items
ADAPTER IRRIG TUBE 2 SPIKE SOL (ADAPTER) ×6 IMPLANT
BAG URINE DRAINAGE (UROLOGICAL SUPPLIES) IMPLANT
BAG URO DRAIN 4000ML (MISCELLANEOUS) IMPLANT
CATH FOL 2WAY LX 20X30 (CATHETERS) IMPLANT
CATH FOL 2WAY LX 22X30 (CATHETERS) ×2 IMPLANT
CATH FOLEY 3WAY 30CC 22FR (CATHETERS) IMPLANT
CATH URETL 5X70 OPEN END (CATHETERS) ×3 IMPLANT
CONTAINER COLLECT MORCELLATR (MISCELLANEOUS) ×1 IMPLANT
COVER WAND RF STERILE (DRAPES) ×1 IMPLANT
DRAPE SHEET LG 3/4 BI-LAMINATE (DRAPES) ×3 IMPLANT
DRAPE UTILITY 15X26 TOWEL STRL (DRAPES) ×2 IMPLANT
FILTER OVERFLOW MORCELLATOR (FILTER) ×1 IMPLANT
GLOVE BIO SURGEON STRL SZ 6.5 (GLOVE) ×4 IMPLANT
GLOVE BIO SURGEONS STRL SZ 6.5 (GLOVE) ×2
GOWN STRL REUS W/ TWL LRG LVL3 (GOWN DISPOSABLE) ×2 IMPLANT
GOWN STRL REUS W/TWL LRG LVL3 (GOWN DISPOSABLE) ×4
HOLDER FOLEY CATH W/STRAP (MISCELLANEOUS) ×3 IMPLANT
KIT TURNOVER CYSTO (KITS) ×3 IMPLANT
LASER FIBER 550M SMARTSCOPE (Laser) ×3 IMPLANT
MORCELLATOR COLLECT CONTAINER (MISCELLANEOUS) ×3
MORCELLATOR OVERFLOW FILTER (FILTER) ×3
MORCELLATOR ROTATION 4.75 335 (MISCELLANEOUS) ×3 IMPLANT
PACK CYSTO AR (MISCELLANEOUS) ×3 IMPLANT
SENSORWIRE 0.038 NOT ANGLED (WIRE)
SET CYSTO W/LG BORE CLAMP LF (SET/KITS/TRAYS/PACK) IMPLANT
SET IRRIG Y TYPE TUR BLADDER L (SET/KITS/TRAYS/PACK) ×3 IMPLANT
SLEEVE PROTECTION STRL DISP (MISCELLANEOUS) ×6 IMPLANT
SOL .9 NS 3000ML IRR  AL (IV SOLUTION) ×22
SOL .9 NS 3000ML IRR UROMATIC (IV SOLUTION) ×4 IMPLANT
SYRINGE IRR TOOMEY STRL 70CC (SYRINGE) ×3 IMPLANT
TUBE PUMP MORCELLATOR PIRANHA (TUBING) ×3 IMPLANT
WATER STERILE IRR 1000ML POUR (IV SOLUTION) ×3 IMPLANT
WIRE SENSOR 0.038 NOT ANGLED (WIRE) IMPLANT

## 2018-08-17 NOTE — Op Note (Signed)
Date of procedure: 08/17/18  Preoperative diagnosis:  1. BPH with BOO 2. Urinary retention   Postoperative diagnosis:  1. same   Procedure: 1. HoLEP with morcellation   Surgeon: Hollice Espy, MD  Anesthesia: General  Complications: None  Intraoperative findings: Bilobed median lobe.  Large capacity bladder, trabeculated.    EBL: 100 cc  Specimens: prostate chips  Drains: 22 Fr 2 way Foley with 30 cc balloon   Indication: Gregory Alexander is a 53 y.o. patient with urinary retention secondary to BPH with BOO, AKI.  After reviewing the management options for treatment, he elected to proceed with the above surgical procedure(s). We have discussed the potential benefits and risks of the procedure, side effects of the proposed treatment, the likelihood of the patient achieving the goals of the procedure, and any potential problems that might occur during the procedure or recuperation. Informed consent has been obtained.  Description of procedure:  The patient was taken to the operating room and general anesthesia was induced.  The patient was placed in the dorsal lithotomy position, prepped and draped in the usual sterile fashion, and preoperative antibiotics were administered. A preoperative time-out was performed.   The patient was taken to the operating room and general anesthesia was induced.  The patient was placed in the dorsal lithotomy position, prepped and draped in the usual sterile fashion, and preoperative antibiotics were administered. A preoperative time-out was performed.     A 26 French resectoscope sheath using a blunt angled obturator was introduced without difficulty into the bladder.  The bladder was carefully inspected and noted to be moderately trabeculated.  There is an elevated bladder neck a bilobed median lobe with a significant intravesical component.  The trigone was able to be visualized with some manipulation and the UOs were an adequate distance bladder neck  itself.  The prostatic fossa had significant trilobar coaptation.  The bladder was significantly trabeculated and large in capacity. A 550 m laser fiber was then brought in and using settings of 0.9 J's and 53 Hz, 2 incisions were created at the 5:00 and 7:00 positions of the bladder neck on either side of the median lobe down to the level of the bladder neck/capsular fibers.  The incision was carried down caudally meeting in the midline just above the verumontanum.  The median lobe was then enucleated from a caudal to cranial direction cleaving the adenoma off the underlying capsule rolling it towards the bladder neck and ultimately cleaving the mucosa to free the median lobe into the bladder.   Next, a semilunar incision was created at the prostatic apex on the left side again freeing up the adenoma from the underlying capsule.  Care was taken to avoid any resection past the verumontanum.  This incision was carried around laterally and cranially towards the bladder neck.  At this point, I was unableI was to complete the anterior commissure mucosa thus elected to move to the other side.     Next, the same similar incision was created at the right prostatic apex.  This adenoma however ended up being enucleated extending the incision to the anterior commisure and ultimately cleaving both lobes together.  Once this was completed and cleared from the bladder neck, the prostatic fossa was noted to be widely patent.  Hemostasis was achieved using hemostatic fiber settings.  Bilateral UOs were visualized and free of any injury.  Finally, the 71 French resectoscope was exchanged for nephroscope and using the Piranha handpiece morcellator, the bladder was distended  in each of the prostate chips were evacuated.  The bladder was irrigated several times and smaller joules were clear for the bladder.  This point time, there were no residual fibers appreciated in the bladder.  Hemostasis was adequate.  10 mg of IV Lasix was  administered to help with postoperative diuresis.  A 22 French two-way Foley catheter was then inserted over a catheter guide with 30 cc in the balloon.  The catheter irrigated easily and well.  Patient was then clean and dry, repositioned supine position, reversed from anesthesia, taken to PACU in stable condition.   Plan: Patient will return to the office in 1 for voiding trial (active fill and pull.)      Hollice Espy, M.D.

## 2018-08-17 NOTE — Anesthesia Procedure Notes (Signed)
Procedure Name: Intubation Date/Time: 08/17/2018 9:05 AM Performed by: Leander Rams, CRNA Pre-anesthesia Checklist: Patient identified, Emergency Drugs available, Suction available, Patient being monitored and Timeout performed Patient Re-evaluated:Patient Re-evaluated prior to induction Oxygen Delivery Method: Circle system utilized Preoxygenation: Pre-oxygenation with 100% oxygen Induction Type: IV induction Ventilation: Two handed mask ventilation required and Oral airway inserted - appropriate to patient size Laryngoscope Size: McGraph and 4 Grade View: Grade I Tube type: Oral Tube size: 7.5 mm Number of attempts: 1 Airway Equipment and Method: Stylet Placement Confirmation: ETT inserted through vocal cords under direct vision Secured at: 23 cm Tube secured with: Tape Dental Injury: Teeth and Oropharynx as per pre-operative assessment  Difficulty Due To: Difficulty was anticipated

## 2018-08-17 NOTE — Discharge Instructions (Addendum)
Indwelling Urinary Catheter Care, Adult An indwelling urinary catheter is a thin tube that is put into your bladder. The tube helps to drain pee (urine) out of your body. The tube goes in through your urethra. Your urethra is where pee comes out of your body. Your pee will come out through the catheter, then it will go into a bag (drainage bag). Take good care of your catheter so it will work well. How to wear your catheter and bag Supplies needed  Sticky tape (adhesive tape) or a leg strap.  Alcohol wipe or soap and water (if you use tape).  A clean towel (if you use tape).  Large overnight bag.  Smaller bag (leg bag). Wearing your catheter Attach your catheter to your leg with tape or a leg strap.  Make sure the catheter is not pulled tight.  If a leg strap gets wet, take it off and put on a dry strap.  If you use tape to hold the bag on your leg: 1. Use an alcohol wipe or soap and water to wash your skin where the tape made it sticky before. 2. Use a clean towel to pat-dry that skin. 3. Use new tape to make the bag stay on your leg. Wearing your bags You should have been given a large overnight bag.  You may wear the overnight bag in the day or night.  Always have the overnight bag lower than your bladder.  Do not let the bag touch the floor.  Before you go to sleep, put a clean plastic bag in a wastebasket. Then hang the overnight bag inside the wastebasket. You should also have a smaller leg bag that fits under your clothes.  Always wear the leg bag below your knee.  Do not wear your leg bag at night. How to care for your skin and catheter Supplies needed  A clean washcloth.  Water and mild soap.  A clean towel. Caring for your skin and catheter      Clean the skin around your catheter every day: ? Wash your hands with soap and water. ? Wet a clean washcloth in warm water and mild soap. ? Clean the skin around your urethra. ? If you are male: ? Gently  spread the folds of skin around your vagina (labia). ? With the washcloth in your other hand, wipe the inner side of your labia on each side. Wipe from front to back. ? If you are male: ? Pull back any skin that covers the end of your penis (foreskin). ? With the washcloth in your other hand, wipe your penis in small circles. Start wiping at the tip of your penis, then move away from the catheter. ? With your free hand, hold the catheter close to where it goes into your body. ? Keep holding the catheter during cleaning so it does not get pulled out. ? With the washcloth in your other hand, clean the catheter. ? Only wipe downward on the catheter. ? Do not wipe upward toward your body. Doing this may push germs into your urethra and cause infection. ? Use a clean towel to pat-dry the catheter and the skin around it. Make sure to wipe off all soap. ? Wash your hands with soap and water.  Shower every day. Do not take baths.  Do not use cream, ointment, or lotion on the area where the catheter goes into your body, unless your doctor tells you to.  Do not use powders, sprays, or lotions  on your genital area.  Check your skin around the catheter every day for signs of infection. Check for: ? Redness, swelling, or pain. ? Fluid or blood. ? Warmth. ? Pus or a bad smell. How to empty the bag Supplies needed  Rubbing alcohol.  Gauze pad or cotton ball.  Tape or a leg strap. Emptying the bag Pour the pee out of your bag when it is ?- full, or at least 2-3 times a day. Do this for your overnight bag and your leg bag. 1. Wash your hands with soap and water. 2. Separate (detach) the bag from your leg. 3. Hold the bag over the toilet or a clean pail. Keep the bag lower than your hips and bladder. This is so the pee (urine) does not go back into the tube. 4. Open the pour spout. It is at the bottom of the bag. 5. Empty the pee into the toilet or pail. Do not let the pour spout touch any  surface. 6. Put rubbing alcohol on a gauze pad or cotton ball. 7. Use the gauze pad or cotton ball to clean the pour spout. 8. Close the pour spout. 9. Attach the bag to your leg with tape or a leg strap. 10. Wash your hands with soap and water. Follow instructions for cleaning the drainage bag:  From the product maker.  As told by your doctor. How to change the bag Supplies needed  Alcohol wipes.  A clean bag.  Tape or a leg strap. Changing the bag Replace your bag with a clean bag once a month. If it starts to leak, smell bad, or look dirty, change it sooner. 1. Wash your hands with soap and water. 2. Separate the dirty bag from your leg. 3. Pinch the catheter with your fingers so that pee does not spill out. 4. Separate the catheter tube from the bag tube where these tubes connect (at the connection valve). Do not let the tubes touch any surface. 5. Clean the end of the catheter tube with an alcohol wipe. Use a different alcohol wipe to clean the end of the bag tube. 6. Connect the catheter tube to the tube of the clean bag. 7. Attach the clean bag to your leg with tape or a leg strap. Do not make the bag tight on your leg. 8. Wash your hands with soap and water. General rules   Never pull on your catheter. Never try to take it out. Doing that can hurt you.  Always wash your hands before and after you touch your catheter or bag. Use a mild, fragrance-free soap. If you do not have soap and water, use hand sanitizer.  Always make sure there are no twists or bends (kinks) in the catheter tube.  Always make sure there are no leaks in the catheter or bag.  Drink enough fluid to keep your pee pale yellow.  Do not take baths, swim, or use a hot tub.  If you are male, wipe from front to back after you poop (have a bowel movement). Contact a doctor if:  Your pee is cloudy.  Your pee smells worse than usual.  Your catheter gets clogged.  Your catheter leaks.  Your  bladder feels full. Get help right away if:  You have redness, swelling, or pain where the catheter goes into your body.  You have fluid, blood, pus, or a bad smell coming from the area where the catheter goes into your body.  Your skin feels warm  where the catheter goes into your body. °· You have a fever. °· You have pain in your: °? Belly (abdomen). °? Legs. °? Lower back. °? Bladder. °· You see blood in the catheter. °· Your pee is pink or red. °· You feel sick to your stomach (nauseous). °· You throw up (vomit). °· You have chills. °· Your pee is not draining into the bag. °· Your catheter gets pulled out. °Summary °· An indwelling urinary catheter is a thin tube that is placed into the bladder to help drain pee (urine) out of the body. °· The catheter is placed into the part of the body that drains pee from the bladder (urethra). °· Taking good care of your catheter will keep it working properly and help prevent problems. °· Always wash your hands before and after touching your catheter or bag. °· Never pull on your catheter or try to take it out. °This information is not intended to replace advice given to you by your health care provider. Make sure you discuss any questions you have with your health care provider. °Document Released: 11/23/2012 Document Revised: 01/19/2018 Document Reviewed: 03/14/2017 °Elsevier Interactive Patient Education © 2019 [employer] ° ° ° ° °AMBULATORY SURGERY  °DISCHARGE INSTRUCTIONS ° ° °1) The drugs that you were given will stay in your system until tomorrow so for the next 24 hours you should not: ° °A) Drive an automobile °B) Make any legal decisions °C) Drink any alcoholic beverage ° ° °2) You may resume regular meals tomorrow.  Today it is better to start with liquids and gradually work up to solid foods. ° °You may eat anything you prefer, but it is better to start with liquids, then soup and crackers, and gradually work up to solid foods. ° ° °3) Please notify  your doctor immediately if you have any unusual bleeding, trouble breathing, redness and pain at the surgery site, drainage, fever, or pain not relieved by medication. ° ° ° °4) Additional Instructions: ° ° ° ° ° ° ° °Please contact your physician with any problems or Same Day Surgery at [phone], Monday through Friday 6 am to 4 pm, or [place] at [address] Main number at [phone]. °

## 2018-08-17 NOTE — Anesthesia Post-op Follow-up Note (Signed)
Anesthesia QCDR form completed.        

## 2018-08-17 NOTE — Interval H&P Note (Signed)
History and Physical Interval Note:  08/17/2018 7:52 AM  Gregory Alexander  has presented today for surgery, with the diagnosis of BPH with urinary obstruction, urinary retention  The various methods of treatment have been discussed with the patient and family. After consideration of risks, benefits and other options for treatment, the patient has consented to  Procedure(s): [place] WITH MORCELLATION (N/A) as a surgical intervention .  The patient's history has been reviewed, patient examined, no change in status, stable for surgery.  I have reviewed the patient's chart and labs.  Questions were answered to the patient's satisfaction.    RRR CTAB  Hollice Espy

## 2018-08-17 NOTE — Anesthesia Preprocedure Evaluation (Addendum)
Anesthesia Evaluation  Patient identified by MRN, date of birth, ID band Patient awake    Reviewed: Allergy & Precautions, H&P , NPO status , Patient's Chart, lab work & pertinent test results  Airway Mallampati: III  TM Distance: <3 FB Neck ROM: full    Dental  (+) Chipped   Pulmonary neg pulmonary ROS,           Cardiovascular hypertension, negative cardio ROS       Neuro/Psych negative neurological ROS  negative psych ROS   GI/Hepatic negative GI ROS, Neg liver ROS,   Endo/Other  negative endocrine ROS  Renal/GU CRFRenal disease     Musculoskeletal   Abdominal   Peds  Hematology negative hematology ROS (+)   Anesthesia Other Findings Past Medical History: No date: Chronic kidney disease     Comment:  hydronephrosis No date: Environmental and seasonal allergies 07/2018: Frequent nosebleeds No date: History of kidney stones     Comment:  30 years ago No date: Hypertension  Past Surgical History: No date: APPENDECTOMY No date: WISDOM TOOTH EXTRACTION  BMI    Body Mass Index:  30.51 kg/m      Reproductive/Obstetrics negative OB ROS                           Anesthesia Physical Anesthesia Plan  ASA: II  Anesthesia Plan: General ETT   Post-op Pain Management:    Induction:   PONV Risk Score and Plan: Dexamethasone, Ondansetron, Midazolam and Treatment may vary due to age or medical condition  Airway Management Planned:   Additional Equipment:   Intra-op Plan:   Post-operative Plan:   Informed Consent: I have reviewed the patients History and Physical, chart, labs and discussed the procedure including the risks, benefits and alternatives for the proposed anesthesia with the patient or authorized representative who has indicated his/her understanding and acceptance.   Dental Advisory Given  Plan Discussed with: Anesthesiologist, CRNA and Surgeon  Anesthesia Plan  Comments:        Anesthesia Quick Evaluation

## 2018-08-17 NOTE — Telephone Encounter (Signed)
-----   Message from Hollice Espy, MD sent at 08/16/2018  1:55 PM EST ----- RUS looks good, all hydronephrosis or "kidney swelling" has resolved.   Hollice Espy, MD

## 2018-08-17 NOTE — Anesthesia Postprocedure Evaluation (Signed)
Anesthesia Post Note  Patient: Gregory Alexander  Procedure(s) Performed: HOLEP-LASER ENUCLEATION OF THE PROSTATE WITH MORCELLATION (N/A Prostate)  Patient location during evaluation: PACU Anesthesia Type: General Level of consciousness: awake and alert Pain management: pain level controlled Vital Signs Assessment: post-procedure vital signs reviewed and stable Respiratory status: spontaneous breathing, nonlabored ventilation, respiratory function stable and patient connected to nasal cannula oxygen Cardiovascular status: blood pressure returned to baseline and stable : nausea, resolved with IM ephedrine and Phenergan. Anesthetic complications: yes Anesthetic complication details: PONV    Last Vitals:  Vitals:   08/17/18 1308 08/17/18 1317  BP:  (!) 142/95  Pulse: 91 97  Resp: 13 16  Temp: 36.9 C   SpO2: 96% 97%    Last Pain:  Vitals:   08/17/18 1317  PainSc: [place]

## 2018-08-17 NOTE — Transfer of Care (Signed)
Immediate Anesthesia Transfer of Care Note  Patient: Gregory Alexander  Procedure(s) Performed: HOLEP-LASER ENUCLEATION OF THE PROSTATE WITH MORCELLATION (N/A Prostate)  Patient Location: PACU  Anesthesia Type:General  Level of Consciousness: awake and drowsy  Airway & Oxygen Therapy: Patient Spontanous Breathing  Post-op Assessment: Report given to RN  Post vital signs: stable  Last Vitals:  Vitals Value Taken Time  BP    Temp    Pulse    Resp    SpO2      Last Pain:  Vitals:   08/17/18 0748  PainSc: 0-No pain         Complications: No apparent anesthesia complications

## 2018-08-18 ENCOUNTER — Encounter: Payer: Self-pay | Admitting: Urology

## 2018-08-18 LAB — SURGICAL PATHOLOGY

## 2018-08-19 NOTE — Telephone Encounter (Signed)
-----   Message from Hollice Espy, MD sent at 08/18/2018  4:34 PM EST ----- FYI- surgical pathology was benign  Hollice Espy, MD

## 2018-08-24 ENCOUNTER — Ambulatory Visit: Payer: BC Managed Care – PPO

## 2018-08-24 ENCOUNTER — Ambulatory Visit (INDEPENDENT_AMBULATORY_CARE_PROVIDER_SITE_OTHER): Payer: BC Managed Care – PPO | Admitting: Family Medicine

## 2018-08-24 DIAGNOSIS — N138 Other obstructive and reflux uropathy: Secondary | ICD-10-CM

## 2018-08-24 DIAGNOSIS — N401 Enlarged prostate with lower urinary tract symptoms: Secondary | ICD-10-CM

## 2018-08-24 LAB — BLADDER SCAN AMB NON-IMAGING: Scan Result: 145

## 2018-08-24 NOTE — Progress Notes (Signed)
Catheter Removal  Patient is present today for a catheter removal.  29ml of water was drained from the balloon. A 22FR foley cath was removed from the bladder no complications were noted . Patient tolerated well.  Preformed by: Elberta Leatherwood, CMA  Follow up/ Additional notes: Patient will return at 2:30 for PVR  PVR was performed with a residual of 145ML

## 2018-09-15 ENCOUNTER — Other Ambulatory Visit: Payer: Self-pay | Admitting: Urology

## 2018-09-15 DIAGNOSIS — R339 Retention of urine, unspecified: Secondary | ICD-10-CM

## 2018-09-15 DIAGNOSIS — N401 Enlarged prostate with lower urinary tract symptoms: Principal | ICD-10-CM

## 2018-09-15 DIAGNOSIS — N138 Other obstructive and reflux uropathy: Secondary | ICD-10-CM

## 2018-09-16 ENCOUNTER — Telehealth: Payer: Self-pay | Admitting: Urology

## 2018-09-16 ENCOUNTER — Ambulatory Visit (INDEPENDENT_AMBULATORY_CARE_PROVIDER_SITE_OTHER): Payer: BC Managed Care – PPO | Admitting: Urology

## 2018-09-16 VITALS — BP 121/83 | HR 98 | Temp 99.3°F | Ht 73.0 in | Wt 233.0 lb

## 2018-09-16 DIAGNOSIS — N401 Enlarged prostate with lower urinary tract symptoms: Secondary | ICD-10-CM

## 2018-09-16 DIAGNOSIS — R300 Dysuria: Secondary | ICD-10-CM | POA: Diagnosis not present

## 2018-09-16 DIAGNOSIS — N453 Epididymo-orchitis: Secondary | ICD-10-CM

## 2018-09-16 DIAGNOSIS — N138 Other obstructive and reflux uropathy: Secondary | ICD-10-CM

## 2018-09-16 DIAGNOSIS — R339 Retention of urine, unspecified: Secondary | ICD-10-CM

## 2018-09-16 LAB — MICROSCOPIC EXAMINATION: Epithelial Cells (non renal): NONE SEEN /hpf (ref 0–10)

## 2018-09-16 LAB — URINALYSIS, COMPLETE

## 2018-09-16 LAB — BLADDER SCAN AMB NON-IMAGING

## 2018-09-16 MED ORDER — SULFAMETHOXAZOLE-TRIMETHOPRIM 800-160 MG PO TABS: 1.0000 | ORAL_TABLET | Freq: Two times a day (BID) | ORAL | 1 refills | Status: DC

## 2018-09-16 MED ORDER — CEFTRIAXONE SODIUM 1 G IJ SOLR
1.0000 g | Freq: Once | INTRAMUSCULAR | Status: AC
  Administered 2018-09-16: 1 g via INTRAMUSCULAR

## 2018-09-16 NOTE — Telephone Encounter (Signed)
There is a refill request for Septra from [facility] pharmacy.  Would you call him and ask if he is having problems or was this just an automatic refill request generated from the pharmacy?

## 2018-09-16 NOTE — Progress Notes (Signed)
09/16/2018 12:52 PM   [name] [id number] [id number]  Referring provider: No referring provider defined for this encounter.  Chief Complaint  Patient presents with  . Recurrent UTI    HPI: Gregory Alexander is a 53 yo M who returns today with his wife, Bennie Pierini, for the evaluation and management of urinary frequency and dysuria which started 3 days ago. He also c/o of testicular pain that started 1 day ago.    His US Renal from 08/10/2018 showed resolved left hydronephrosis, diverticula, and urinary bladder minimally distended.   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   He saw his PCP on 09/01/2018 and was having symptoms of dysuria and cloudy urine.  His PCP, [facility]Yolanda at [facility] gave him 10 days worth of Cipro. His UA on 09/01/2018 of visit showed 1+ glucose, moderate blood, 300 mg/dL protein, 1.0 urobilinogen, and nitrite positive. He took Azo during this time so results may be skewed.  He had a positive urine culture for E. Coli on 09/01/2018 and was sensitive to Cipro.  He reports of fever reaching 100 F and chills, dysuria and right testicular pain for the last three days.  His PVR today was 83 mL.  He has been on Bactrim in the last 30 days.   His UA today shows 6-10 WBC and moderate bacteria.   PMH: Past Medical History:  Diagnosis Date  . Chronic kidney disease    hydronephrosis  . Environmental and seasonal allergies   . Frequent nosebleeds 07/2018  . History of kidney stones    30 years ago  . Hypertension     Surgical History: Past Surgical History:  Procedure Laterality Date  . APPENDECTOMY    . HOLEP-LASER ENUCLEATION OF THE PROSTATE WITH MORCELLATION N/A 08/17/2018   Procedure: HOLEP-LASER ENUCLEATION OF THE PROSTATE WITH MORCELLATION;  Surgeon: Hollice Espy, MD;  Location: [facility] ORS;  Service: Urology;  Laterality: N/A;  . WISDOM TOOTH EXTRACTION      Home Medications:  Allergies as of 09/16/2018      Reactions   Dust  Mite Extract Other (See Comments)   All year long, experiences sinus type symptoms      Medication List       Accurate as of September 16, 2018 12:52 PM. Always use your most recent med list.        amLODipine 2.5 MG tablet Commonly known as:  NORVASC Take 2.5 mg by mouth daily.   atorvastatin 40 MG tablet Commonly known as:  LIPITOR Take 40 mg by mouth daily.   bacitracin 500 UNIT/GM ointment Apply 1 application topically 4 (four) times daily as needed for wound care.   docusate sodium 100 MG capsule Commonly known as:  COLACE Take 1 capsule (100 mg total) by mouth 2 (two) times daily.   FLONASE SENSIMIST 27.5 MCG/SPRAY nasal spray Generic drug:  fluticasone Place 2 sprays into the nose 2 (two) times daily.   GOODYS EXTRA STRENGTH 520-260-32.5 MG Pack Generic drug:  Aspirin-Acetaminophen-Caffeine Take 1-2 packets by mouth 2 (two) times daily as needed (for pain or headache).   HYDROcodone-acetaminophen 5-325 MG tablet Commonly known as:  NORCO/VICODIN Take 1-2 tablets by mouth every 6 (six) hours as needed for moderate pain.   oxybutynin 5 MG tablet Commonly known as:  DITROPAN Take 1 tablet (5 mg total) by mouth every 8 (eight) hours as needed for bladder spasms.   oxymetazoline 0.05 % nasal spray Commonly known as:  AFRIN Place 1 spray  into both nostrils 2 (two) times daily as needed for congestion.   sulfamethoxazole-trimethoprim 800-160 MG tablet Commonly known as:  BACTRIM DS,SEPTRA DS Take 1 tablet by mouth every 12 (twelve) hours.   sulfamethoxazole-trimethoprim 800-160 MG tablet Commonly known as:  BACTRIM DS,SEPTRA DS Take 1 tablet by mouth every 12 (twelve) hours.   tamsulosin 0.4 MG Caps capsule Commonly known as:  FLOMAX Take 0.4 mg by mouth daily.       Allergies:  Allergies  Allergen Reactions  . Dust Mite Extract Other (See Comments)    All year long, experiences sinus type symptoms    Family History: Family History  Problem Relation  Age of Onset  . Prostate cancer Father   . Kidney cancer Neg Hx     Social History:  reports that he has never smoked. He has never used smokeless tobacco. He reports current alcohol use. He reports that he does not use drugs.  ROS: UROLOGY Frequent Urination?: Yes Hard to postpone urination?: Yes Burning/pain with urination?: Yes Get up at night to urinate?: No Leakage of urine?: Yes Urine stream starts and stops?: No Trouble starting stream?: No Do you have to strain to urinate?: No Blood in urine?: No Urinary tract infection?: Yes Sexually transmitted disease?: No Injury to kidneys or bladder?: No Painful intercourse?: No Weak stream?: No Erection problems?: No Penile pain?: No  Gastrointestinal Nausea?: No Vomiting?: No Indigestion/heartburn?: No Diarrhea?: No Constipation?: No  Constitutional Fever: Yes Night sweats?: Yes Weight loss?: No Fatigue?: No  Skin Skin rash/lesions?: No Itching?: No  Eyes Blurred vision?: No Double vision?: No  Ears/Nose/Throat Sore throat?: No Sinus problems?: No  Hematologic/Lymphatic Swollen glands?: No Easy bruising?: No  Cardiovascular Leg swelling?: No Chest pain?: No  Respiratory Cough?: No Shortness of breath?: No  Endocrine Excessive thirst?: No  Musculoskeletal Back pain?: Yes Joint pain?: No  Neurological Headaches?: No Dizziness?: No  Psychologic Depression?: No Anxiety?: No  Physical Exam: BP 121/83   Pulse 98   Temp 99.3 F (37.4 C) (Oral)   Ht 6\' 1"  (1.854 m)   Wt 233 lb (105.7 kg)   BMI 30.74 kg/m   Constitutional:  Well nourished. Alert and oriented, No acute distress. HEENT: [place] AT, moist mucus membranes.  Trachea midline, no masses. Cardiovascular: No clubbing, cyanosis, or edema. Respiratory: Normal respiratory effort, no increased work of breathing. GU: No CVA tenderness.  No bladder fullness or masses.  Patient with circumcised. Urethral meatus is patent.  No penile  discharge. No penile lesions or rashes. Scrotum without lesions, cysts, rashes and/or edema. Right scrotum erythematous. Testicles are located scrotally bilaterally. Right testicle indurated and tender. No masses are appreciated in the testicles. Left and right epididymis are normal. Rectal: Patient with  normal sphincter tone. Anus and perineum without scarring or rashes. No rectal masses are appreciated. Prostate is approximately 50 grams, could only palpate apex and mid-portion, no fluctuance noted, no nodules are appreciated.  Skin: No rashes, bruises or suspicious lesions. Neurologic: Grossly intact, no focal deficits, moving all 4 extremities. Psychiatric: Normal mood and affect.  Laboratory Data:  Urinalysis His UA today shows 6-10 WBC and moderate bacteria. See Epic.   I have reviewed labs.   Pertinent Imaging:  Results for orders placed during the hospital encounter of 08/10/18  US RENAL   Narrative CLINICAL DATA:  Bilateral hydronephrosis.  EXAM: RENAL / URINARY TRACT ULTRASOUND COMPLETE  COMPARISON:  Ultrasound of June 30, 2018.  FINDINGS: Right Kidney:  Renal measurements: 12.0 x 6.6  x 5.8 cm = volume: 238 mL. 1.2 cm simple cyst is seen in midpole. Echogenicity within normal limits. No mass or hydronephrosis visualized.  Left Kidney:  Renal measurements: 9.0 x 5.2 x 4.8 cm = volume: 117 mL. Lobulated contours are noted. 1.5 cm simple cyst is seen in lower pole. Echogenicity within normal limits. No mass or hydronephrosis visualized.  Bladder:  Foley catheter is noted. Urinary bladder is only minimally distended. Diffuse bladder wall thickening is noted which may represent lack of distension, but cystitis can not be excluded.  IMPRESSION: Left hydronephrosis noted on prior exam has resolved. Lobulated left renal cortex is noted suggesting scarring. Small bilateral simple renal cysts are noted.  Foley catheter is noted within urinary bladder. Urinary  bladder is only minimally distended, and there is diffuse bladder wall thickening which may be due to lack of distension, but cystitis can not be excluded.   Electronically Signed   By: Marijo Conception, M.D.   On: 08/10/2018 08:51    I have reviewed US renal.   Assessment & Plan:    1. Dysuria / Urinary frequency -UA showed 6-10 WBC and moderate bacteria.  -Urine culture sent - pending  -Rocephin 1g given today  -Rx of bactrim sent - take 1 tablet by mouth every 12 hours x30 days Patient is advised that if they should start to experience pain, intractable nausea and/or vomiting and/or fevers greater than 103 or shaking chills to contact the office immediately or seek treatment in the emergency department for emergent intervention.    2. Right epididymitis/orchitis  -PE showed right testicle indurated and tender.  -see above  3. BPH with a history of retention -s/p HoLEP on 08/17/2018 - residuals improved -has follow up on 09/30/2018   Return for pending urine culture .  Zara Council, PA-C  [facility] [address], [place], [place] [address] [phone]  I, Lucas Mallow, am acting as a Education administrator for [employer],  I have reviewed the above documentation for accuracy and completeness, and I agree with the above.    Zara Council, PA-C

## 2018-09-16 NOTE — Progress Notes (Signed)
IM Injection  Patient is present today for an IM Injection Drug: Rocephin Dose:1 GM Location:Left upper outer buttocks Lot: [id number] Exp:05/2019 Patient tolerated well, no complications were noted  Preformed by: Fonnie Jarvis, CMA  Additional notes/ Follow up: patient was kept in the office for 50min post injection to make sure there was no reaction

## 2018-09-16 NOTE — Progress Notes (Signed)
Patient presents today with urinary frequency and dysuria and fever and chills. His urine symptoms began 3 days ago. Patient states he has not had any Urological surgeries in the last 30 days. He has been on Bactrim in the last 30 days. A urine was collected for UA, UCX. Patient also complains of testicle swelling and pain. I informed Larene Beach and she will see patient today for his testicle pain.

## 2018-09-16 NOTE — Telephone Encounter (Signed)
Spoke to patient and he feels he has a UTI. I scheduled patient to come in for a UA, UCX on Nurse schedule.

## 2018-09-17 ENCOUNTER — Telehealth: Payer: Self-pay | Admitting: Urology

## 2018-09-17 NOTE — Telephone Encounter (Signed)
Spoke with patient regarding how he is feeling today.  He states the scrotal swelling has reduced in size and his fever is down to 98.

## 2018-09-18 ENCOUNTER — Ambulatory Visit (INDEPENDENT_AMBULATORY_CARE_PROVIDER_SITE_OTHER): Payer: BC Managed Care – PPO | Admitting: Urology

## 2018-09-18 ENCOUNTER — Encounter: Payer: Self-pay | Admitting: Urology

## 2018-09-18 VITALS — BP 120/85 | HR 103 | Temp 98.0°F | Ht 73.0 in | Wt 233.0 lb

## 2018-09-18 DIAGNOSIS — N401 Enlarged prostate with lower urinary tract symptoms: Secondary | ICD-10-CM | POA: Diagnosis not present

## 2018-09-18 DIAGNOSIS — N138 Other obstructive and reflux uropathy: Secondary | ICD-10-CM | POA: Diagnosis not present

## 2018-09-18 LAB — BLADDER SCAN AMB NON-IMAGING

## 2018-09-18 NOTE — Progress Notes (Signed)
09/18/2018  10:23 AM   [name] [birth year] [id number]  Referring provider: No referring provider defined for this encounter.  Chief Complaint  Patient presents with  . Groin Swelling    HPI: Gregory Alexander is a 53 y.o. male Caucasian with urinary frequency and dysuria who presents today for a swollen right testicle, with his wife, Bennie Pierini.  Background History His US Renal from 08/10/2018 showed resolved left hydronephrosis, diverticula, and urinary bladder minimally distended.   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   He saw his PCP on 09/01/2018 and was having symptoms of dysuria and cloudy urine.  His PCP, Dr.Yolanda at [facility] gave him 10 days worth of Cipro. His UA on 09/01/2018 of visit showed 1+ glucose, moderate blood, 300 mg/dL protein, 1.0 urobilinogen, and nitrite positive. He took Azo during this time so results may be skewed.  He had a positive urine culture for E. Coli on 09/01/2018 and was sensitive to Cipro.  He appeared on 09/16/2018 for urinary frequency and dysuria which started 3 days before and testicular pain that started 1 day before that visit.  He reported fever reaching 100 F and chills, dysuria and right testicular pain for the last three days.  His PVR today was 83 mL.  He had been on Bactrim in the last 30 days.   His UA that day showed 6-10 WBC and moderate bacteria.  The urine culture's preliminary results are Escherichia coli.  Patient says today that it has improved, but mostly at this point it has been UTI symptoms with his right testicle being much slower.  He has been particularly bothered by the problems it has been causing with his mobility.  He is experiencing night sweats.  PMH: Past Medical History:  Diagnosis Date  . Chronic kidney disease    hydronephrosis  . Environmental and seasonal allergies   . Frequent nosebleeds 07/2018  . History of kidney stones    30 years ago  . Hypertension      Surgical History: Past Surgical History:  Procedure Laterality Date  . APPENDECTOMY    . HOLEP-LASER ENUCLEATION OF THE PROSTATE WITH MORCELLATION N/A 08/17/2018   Procedure: HOLEP-LASER ENUCLEATION OF THE PROSTATE WITH MORCELLATION;  Surgeon: Hollice Espy, MD;  Location: [facility] ORS;  Service: Urology;  Laterality: N/A;  . WISDOM TOOTH EXTRACTION      Home Medications:  Allergies as of 09/18/2018      Reactions   Dust Mite Extract Other (See Comments)   All year long, experiences sinus type symptoms      Medication List       Accurate as of September 18, 2018 10:23 AM. Always use your most recent med list.        amLODipine 2.5 MG tablet Commonly known as:  NORVASC Take 2.5 mg by mouth daily.   atorvastatin 40 MG tablet Commonly known as:  LIPITOR Take 40 mg by mouth daily.   bacitracin 500 UNIT/GM ointment Apply 1 application topically 4 (four) times daily as needed for wound care.   docusate sodium 100 MG capsule Commonly known as:  COLACE Take 1 capsule (100 mg total) by mouth 2 (two) times daily.   FLONASE SENSIMIST 27.5 MCG/SPRAY nasal spray Generic drug:  fluticasone Place 2 sprays into the nose 2 (two) times daily.   GOODYS EXTRA STRENGTH 520-260-32.5 MG Pack Generic drug:  Aspirin-Acetaminophen-Caffeine Take 1-2 packets by mouth 2 (two) times daily as needed (for pain or headache).  HYDROcodone-acetaminophen 5-325 MG tablet Commonly known as:  NORCO/VICODIN Take 1-2 tablets by mouth every 6 (six) hours as needed for moderate pain.   oxybutynin 5 MG tablet Commonly known as:  DITROPAN Take 1 tablet (5 mg total) by mouth every 8 (eight) hours as needed for bladder spasms.   oxymetazoline 0.05 % nasal spray Commonly known as:  AFRIN Place 1 spray into both nostrils 2 (two) times daily as needed for congestion.   sulfamethoxazole-trimethoprim 800-160 MG tablet Commonly known as:  BACTRIM DS,SEPTRA DS Take 1 tablet by mouth every 12 (twelve) hours.    sulfamethoxazole-trimethoprim 800-160 MG tablet Commonly known as:  BACTRIM DS,SEPTRA DS Take 1 tablet by mouth every 12 (twelve) hours.   tamsulosin 0.4 MG Caps capsule Commonly known as:  FLOMAX Take 0.4 mg by mouth daily.       Allergies:  Allergies  Allergen Reactions  . Dust Mite Extract Other (See Comments)    All year long, experiences sinus type symptoms    Family History: Family History  Problem Relation Age of Onset  . Prostate cancer Father   . Kidney cancer Neg Hx     Social History:  reports that he has never smoked. He has never used smokeless tobacco. He reports current alcohol use. He reports that he does not use drugs.  ROS: UROLOGY Frequent Urination?: No Hard to postpone urination?: No Burning/pain with urination?: No Get up at night to urinate?: No Leakage of urine?: No Urine stream starts and stops?: No Trouble starting stream?: No Do you have to strain to urinate?: No Blood in urine?: No Urinary tract infection?: No Sexually transmitted disease?: No Injury to kidneys or bladder?: No Painful intercourse?: No Weak stream?: No Erection problems?: No Penile pain?: No  Gastrointestinal Nausea?: No Vomiting?: No Indigestion/heartburn?: No Diarrhea?: No Constipation?: No  Constitutional Fever: No Night sweats?: No Weight loss?: No Fatigue?: No  Skin Skin rash/lesions?: No Itching?: No  Eyes Blurred vision?: No Double vision?: No  Ears/Nose/Throat Sore throat?: No Sinus problems?: No  Hematologic/Lymphatic Swollen glands?: No Easy bruising?: No  Cardiovascular Leg swelling?: No Chest pain?: No  Respiratory Cough?: No Shortness of breath?: No  Endocrine Excessive thirst?: No  Musculoskeletal Back pain?: No Joint pain?: No  Neurological Headaches?: No Dizziness?: No  Psychologic Depression?: No Anxiety?: No  Physical Exam: BP 120/85 (BP Location: Left Arm, Patient Position: Sitting)   Pulse (!) 103    Temp 98 F (36.7 C)   Ht 6\' 1"  (1.854 m)   Wt 233 lb (105.7 kg)   BMI 30.74 kg/m   Constitutional:  Well nourished. Alert and oriented, No acute distress. Cardiovascular: No clubbing, cyanosis, or edema. Respiratory: Normal respiratory effort, no increased work of breathing. GU: No CVA tenderness.  No bladder fullness or masses.  Patient with circumcised phallus.   Urethral meatus is patent.  No penile discharge. No penile lesions or rashes. Scrotum without lesions, cysts, rashes and/or edema.  Testicles are located scrotally bilaterally. Right testicle indurated and tender.  No fluctuance or crepitus.  No masses are appreciated in the testicles.  Left and right epididymis are normal. Skin: No rashes, bruises or suspicious lesions. Neurologic: Grossly intact, no focal deficits, moving all 4 extremities. Psychiatric: Normal mood and affect.  Laboratory Data:  Lab Results  Component Value Date   CREATININE 1.50 (H) 08/03/2018   Urinalysis    Component Value Date/Time   COLORURINE YELLOW (A) 08/10/2018 0928   APPEARANCEUR Cloudy (A) 09/16/2018 1116   LABSPEC  1.019 08/10/2018 0928   PHURINE 6.0 08/10/2018 0928   GLUCOSEU CANCELED 09/16/2018 1116   HGBUR LARGE (A) 08/10/2018 0928   BILIRUBINUR NEGATIVE 08/10/2018 0928   BILIRUBINUR Negative 07/23/2018 1425   KETONESUR NEGATIVE 08/10/2018 0928   PROTEINUR CANCELED 09/16/2018 1116   PROTEINUR NEGATIVE 08/10/2018 0928   NITRITE NEGATIVE 08/10/2018 0928   LEUKOCYTESUR NEGATIVE 08/10/2018 0928   LEUKOCYTESUR Negative 07/23/2018 1425   I have reviewed the labs.  Pertinent Imaging:  Results for orders placed during the hospital encounter of 08/10/18  US RENAL   Narrative CLINICAL DATA:  Bilateral hydronephrosis.  EXAM: RENAL / URINARY TRACT ULTRASOUND COMPLETE  COMPARISON:  Ultrasound of June 30, 2018.  FINDINGS: Right Kidney:  Renal measurements: 12.0 x 6.6 x 5.8 cm = volume: 238 mL. 1.2 cm simple cyst is seen in  midpole. Echogenicity within normal limits. No mass or hydronephrosis visualized.  Left Kidney:  Renal measurements: 9.0 x 5.2 x 4.8 cm = volume: 117 mL. Lobulated contours are noted. 1.5 cm simple cyst is seen in lower pole. Echogenicity within normal limits. No mass or hydronephrosis visualized.  Bladder:  Foley catheter is noted. Urinary bladder is only minimally distended. Diffuse bladder wall thickening is noted which may represent lack of distension, but cystitis can not be excluded.  IMPRESSION: Left hydronephrosis noted on prior exam has resolved. Lobulated left renal cortex is noted suggesting scarring. Small bilateral simple renal cysts are noted.  Foley catheter is noted within urinary bladder. Urinary bladder is only minimally distended, and there is diffuse bladder wall thickening which may be due to lack of distension, but cystitis can not be excluded.   Electronically Signed   By: Marijo Conception, M.D.   On: 08/10/2018 08:51    I have reviewed US renal.   Assessment & Plan:    1. Dysuria / Urinary frequency - UA on 09/16/2018 showed 6-10 WBC and moderate bacteria.  - Urine culture sent - preliminary results are E coli - Rocephin 1g given on 09/16/2018 - continue bactrim - will adjust if necessary once sensitivities have returned  -Patient is advised that if they should start to experience pain, intractable nausea and/or vomiting and/or fevers greater than 103 or shaking chills to contact the office immediately or seek treatment in the emergency department for emergent intervention.    2. Right epididymitis/orchitis  - PE showed right testicle indurated and tender.  - Ibuprofen 200 mg every 6 hours suggested to help with mobility - Warm baths suggested to help speed up recovery - Patient has been reassured as to induration being normal, and advised as to what symptoms to watch for to indicate that he needs to seek treatment immediately - See above  3. BPH  with a history of retention - s/p HoLEP on 08/17/2018 - residuals improved - has follow up on 09/30/2018   Return for keep follow up with Dr. Erlene Quan in 02/19.  Zara Council, PA-C  [facility] [address], [place], [place] [address] [phone]  I, Adele Schilder, am acting as a Education administrator for [employer], PA-C.   I have reviewed the above documentation for accuracy and completeness, and I agree with the above.    Zara Council, PA-C

## 2018-09-20 LAB — CULTURE, URINE COMPREHENSIVE

## 2018-09-30 ENCOUNTER — Ambulatory Visit (INDEPENDENT_AMBULATORY_CARE_PROVIDER_SITE_OTHER): Payer: BC Managed Care – PPO | Admitting: Urology

## 2018-09-30 ENCOUNTER — Ambulatory Visit: Payer: BC Managed Care – PPO | Admitting: Urology

## 2018-09-30 ENCOUNTER — Encounter: Payer: Self-pay | Admitting: Urology

## 2018-09-30 VITALS — BP 131/83 | HR 105 | Ht 73.0 in | Wt 234.6 lb

## 2018-09-30 DIAGNOSIS — N393 Stress incontinence (female) (male): Secondary | ICD-10-CM

## 2018-09-30 DIAGNOSIS — N401 Enlarged prostate with lower urinary tract symptoms: Secondary | ICD-10-CM

## 2018-09-30 DIAGNOSIS — N390 Urinary tract infection, site not specified: Secondary | ICD-10-CM

## 2018-09-30 DIAGNOSIS — N138 Other obstructive and reflux uropathy: Secondary | ICD-10-CM

## 2018-09-30 LAB — BLADDER SCAN AMB NON-IMAGING: Scan Result: 37

## 2018-09-30 NOTE — Patient Instructions (Signed)
The Male Pelvic Floor Muscles  The pelvic floor consists of several layers of muscles that cover the bottom of the pelvic cavity. These muscles have several distinct roles:  1. To support the pelvic organs, the bladder and colon within the pelvis. 2. To assist in stopping and starting the flow of urine or the passage of gas or stool. 3. To aid in sexual appreciation.    How to Locate the Pelvic Floor Muscles  The Urine Stop Test . At the midstream of your urine flow, squeeze the pelvic floor muscles. You should feel the sensation of the openings close and the muscles pulling the penis and anus up and in to the pelvic cavity.  If you have strong muscles you will slow or stop the stream of urine. . Try to stop or slow the flow of urine without tensing the muscles of your legs, buttocks. . Do this only to locate the muscles, not as a daily exercise. Feeling the Muscle . Place a fingertip on or into the rectal opening.  Contract and lift the muscles as though you were holding back gas or a bowel movement.   . You will feel your anal opening tighten and your penis move slightly. Watching the Muscles Contract . Begin by lying on a flat surface.  Position yourself with your knees apart and bent with your head elevated and supported on several pillows.  Use a mirror to look at the anal opening and penis.  . Contract or tighten the muscles around the anal opening and watch for a puckering and lifting of the anus and slight movement of the penis.   . If you see a bulge of your anus this is an incorrect contraction and you should notify your health care provider for more instructions.   2007, [employer] Doc.12 

## 2018-09-30 NOTE — Progress Notes (Signed)
09/30/2018 9:22 AM   Gregory Alexander 13-Jun-1966 [id number]  Referring provider: [facility] [address] [place], [place] [address]  Chief Complaint  Patient presents with  . Urinary Retention    HPI: Gregory Alexander is a 53 yo Male with a history of BPH with urinary obstruction, urinary retention, bilateral hydronephrosis, and acute kidney injury returns today for a 6 week f/u post-op HoLEP.   Cystoscopy from 07/23/2018 showed heavily trabeculation with multiple diverticula.  RUS from 08/10/2018 showed resolution of left hydronephrosis.   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 Surgical pathology from was benign.   He saw his PCP on 09/01/2018 and was having symptoms of dysuria and cloudy urine.  His PCP, Dr.Yolanda at [facility] gave him 10 days worth of Cipro. His UA on 09/01/2018  showed 1+ glucose, moderate blood, 300 mg/dL protein, 1.0 urobilinogen, and nitrite positive. He took Azo during this time so results may be skewed.  He had a positive urine culture for E. Coli on 09/01/2018 and was sensitive to Cipro.  He returned on 09/16/2018 for urinary frequency and dysuria which occurred 3 days before. He had testicular pain that started 1 day before that visit.  He reported of fever reaching 100 F along with symptoms including chills, dysuria and right testicular pain for the last three days. He had been on Bactrim in the last 30 days. His previous UA showed 6-10 WBC and moderate bacteria.  The urine culture's preliminary results are Escherichia coli.  He reports of leakage with physical activity and improvement in nocturia.  Overall, he is improving.  Hhe reports of a good stream with good pressure and denies spraying of urine. His urgency and frequency has improved and is not bothersome. He denies gross hematuria and dysuria.   He has discontinued oxybutynin and pain medications. He is currently taking Flomax.   His PVR today is 37 mL,  voiding well.     IPSS    Row Name 09/30/18 0800         International Prostate Symptom Score   How often have you had the sensation of not emptying your bladder?  Not at All     How often have you had to urinate less than every two hours?  Less than half the time     How often have you found you stopped and started again several times when you urinated?  Not at All     How often have you found it difficult to postpone urination?  Less than half the time     How often have you had a weak urinary stream?  Not at All     How often have you had to strain to start urination?  Not at All     How many times did you typically get up at night to urinate?  2 Times     Total IPSS Score  6       Quality of Life due to urinary symptoms   If you were to spend the rest of your life with your urinary condition just the way it is now how would you feel about that?  Mixed        Score:  1-7 Mild 8-19 Moderate 20-35 Severe  PMH: Past Medical History:  Diagnosis Date  . Chronic kidney disease    hydronephrosis  . Environmental and seasonal allergies   . Frequent nosebleeds 07/2018  . History of kidney stones  30 years ago  . Hypertension     Surgical History: Past Surgical History:  Procedure Laterality Date  . APPENDECTOMY    . HOLEP-LASER ENUCLEATION OF THE PROSTATE WITH MORCELLATION N/A 08/17/2018   Procedure: HOLEP-LASER ENUCLEATION OF THE PROSTATE WITH MORCELLATION;  Surgeon: Hollice Espy, MD;  Location: [facility] ORS;  Service: Urology;  Laterality: N/A;  . WISDOM TOOTH EXTRACTION      Home Medications:  Allergies as of 09/30/2018      Reactions   Dust Mite Extract Other (See Comments)   All year long, experiences sinus type symptoms      Medication List       Accurate as of September 30, 2018  9:22 AM. Always use your most recent med list.        amLODipine 2.5 MG tablet Commonly known as:  NORVASC Take 2.5 mg by mouth daily.   atorvastatin 40 MG tablet Commonly  known as:  LIPITOR Take 40 mg by mouth daily.   FLONASE SENSIMIST 27.5 MCG/SPRAY nasal spray Generic drug:  fluticasone Place 2 sprays into the nose 2 (two) times daily.   GOODYS EXTRA STRENGTH 520-260-32.5 MG Pack Generic drug:  Aspirin-Acetaminophen-Caffeine Take 1-2 packets by mouth 2 (two) times daily as needed (for pain or headache).   oxymetazoline 0.05 % nasal spray Commonly known as:  AFRIN Place 1 spray into both nostrils 2 (two) times daily as needed for congestion.   sulfamethoxazole-trimethoprim 800-160 MG tablet Commonly known as:  BACTRIM DS,SEPTRA DS Take 1 tablet by mouth every 12 (twelve) hours.       Allergies:  Allergies  Allergen Reactions  . Dust Mite Extract Other (See Comments)    All year long, experiences sinus type symptoms    Family History: Family History  Problem Relation Age of Onset  . Prostate cancer Father   . Kidney cancer Neg Hx     Social History:  reports that he has never smoked. He has never used smokeless tobacco. He reports current alcohol use. He reports that he does not use drugs.  ROS: UROLOGY Frequent Urination?: No Hard to postpone urination?: No Burning/pain with urination?: No Get up at night to urinate?: Yes Leakage of urine?: Yes Urine stream starts and stops?: No Trouble starting stream?: No Do you have to strain to urinate?: No Blood in urine?: No Urinary tract infection?: No Sexually transmitted disease?: No Injury to kidneys or bladder?: No Painful intercourse?: No Weak stream?: No Erection problems?: No Penile pain?: No  Gastrointestinal Nausea?: No Vomiting?: No Indigestion/heartburn?: No Diarrhea?: No Constipation?: No  Constitutional Fever: No Night sweats?: No Weight loss?: No Fatigue?: No  Skin Skin rash/lesions?: Yes Itching?: Yes  Eyes Blurred vision?: No Double vision?: No  Ears/Nose/Throat Sore throat?: No Sinus problems?: No  Hematologic/Lymphatic Swollen glands?:  No Easy bruising?: No  Cardiovascular Leg swelling?: No Chest pain?: No  Respiratory Cough?: No Shortness of breath?: No  Endocrine Excessive thirst?: No  Musculoskeletal Back pain?: No Joint pain?: No  Neurological Headaches?: No Dizziness?: No  Psychologic Depression?: No Anxiety?: No  Physical Exam: BP 131/83 (BP Location: Left Arm, Patient Position: Sitting, Cuff Size: Normal)   Pulse (!) 105   Ht 6\' 1"  (1.854 m)   Wt 234 lb 9.6 oz (106.4 kg)   BMI 30.95 kg/m   Constitutional:  Alert and oriented, No acute distress. HEENT: [place] AT, moist mucus membranes.  Trachea midline, no masses. Cardiovascular: No clubbing, cyanosis, or edema. Respiratory: Normal respiratory effort, no increased work of  breathing. Skin: No rashes, bruises or suspicious lesions. Neurologic: Grossly intact, no focal deficits, moving all 4 extremities. Psychiatric: Normal mood and affect.  Laboratory Data:  Pertinent Imaging: Results for orders placed or performed in visit on 09/30/18  Bladder Scan (Post Void Residual) in office  Result Value Ref Range   Scan Result 37     Assessment & Plan:    1. BPH with a history of retention s/p HoLEP on 08/17/2018 -PVR today normal, no evidence of incomplete emptying Return in 6 months for IPSS/ PVR/ PSA  2. Stress urinary incontinence (mild)  Recommended pelvic floor exercises and mentioned PT referral if needed  Advised to discontinue Flomax and evaluate if symptoms persist    3. rUTI Surgical pathology measured potential reduced weight of prostate chips which can be due to measurement inaccuracy or chips still retained in bladder; ?reason for rUTI  Cystoscopy recommended if rUTI persist; formal bladder ultrasound with distended bladder also discussed since pt is not fond of cysto   Return in about 6 months (around 03/31/2019) for IPSS/ PVR/ PSA .  [facility] [address] [place], [place] [address] [phone]  I, Lucas Mallow, am acting as a scribe for Dr. Hollice Espy,  I have reviewed the above documentation for accuracy and completeness, and I agree with the above.   Hollice Espy, MD

## 2018-10-27 ENCOUNTER — Encounter: Payer: Self-pay | Admitting: Urology

## 2018-12-25 IMAGING — US US RENAL
1 series · 14 of 25 positions shown · non-contrast
Comparison: None

CLINICAL DATA: Benign prostatic hypertrophy with urinary
obstruction, hydronephrosis, flank discomfort which has decreased
since the large amount of urine was removed

EXAM:
RENAL / URINARY TRACT ULTRASOUND COMPLETE

[Series 1: us renal · 0.26mm/px · 14 of 48 slices shown]
[im 1/48]
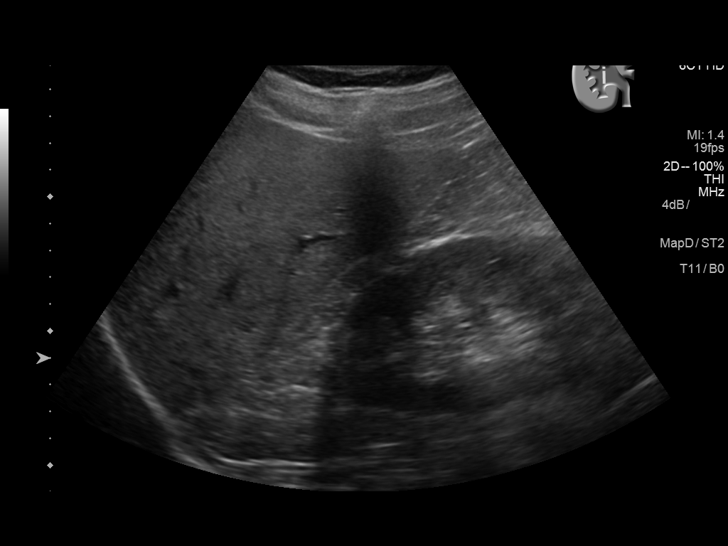
[im 4/48]
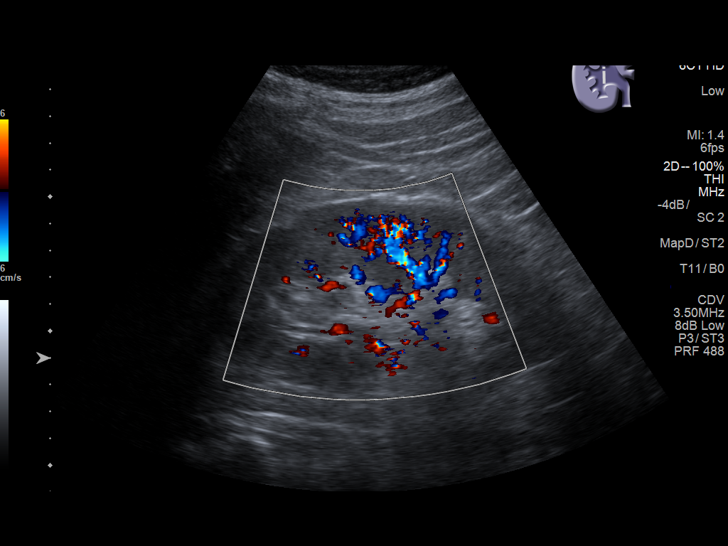
[im 8/48]
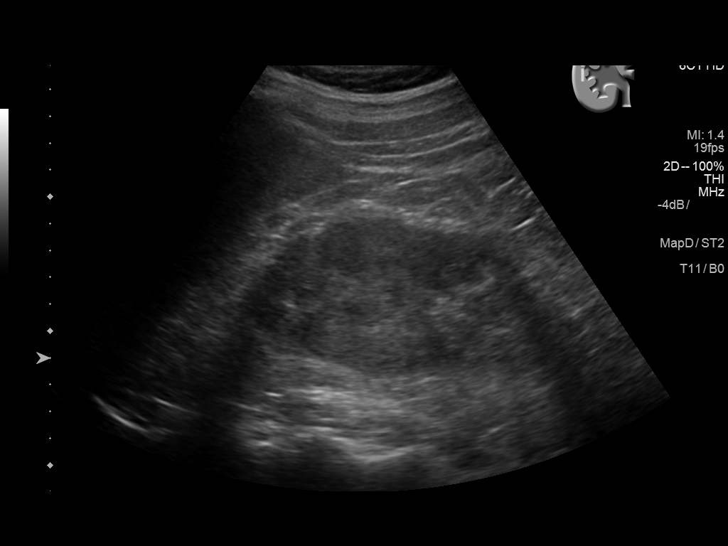
[im 12/48]
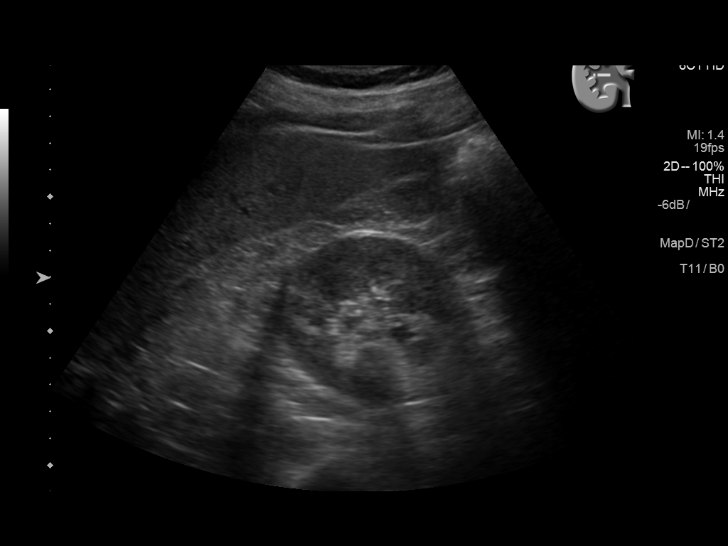
[im 16/48]
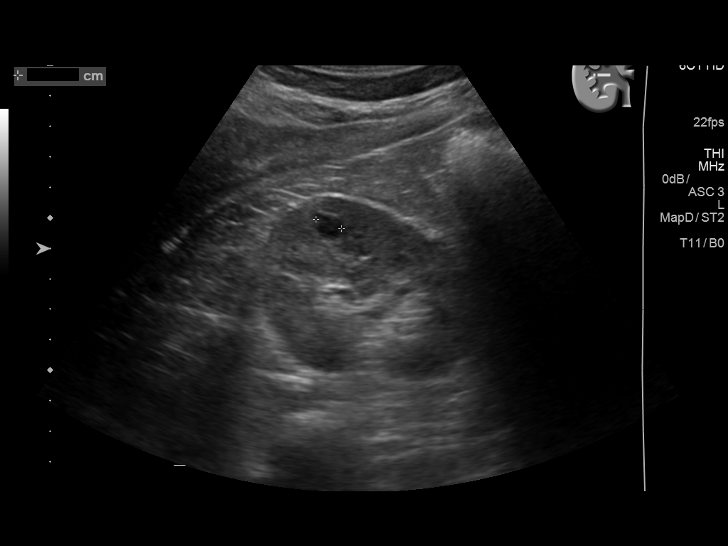
[im 18/48]
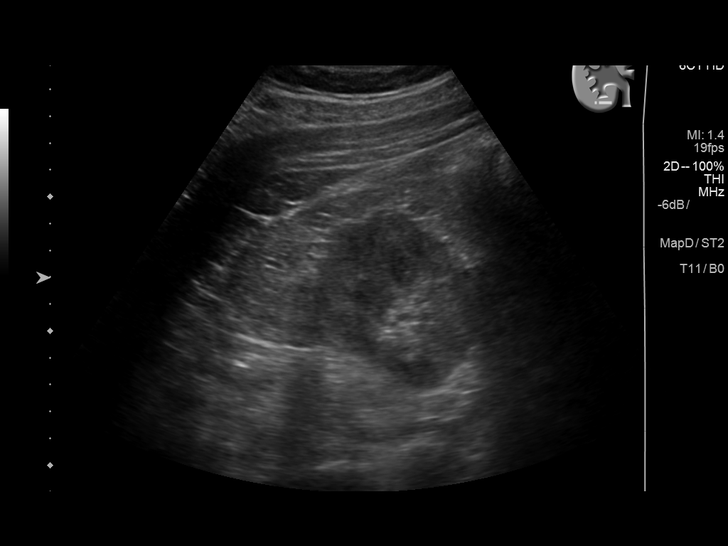
[im 22/48]
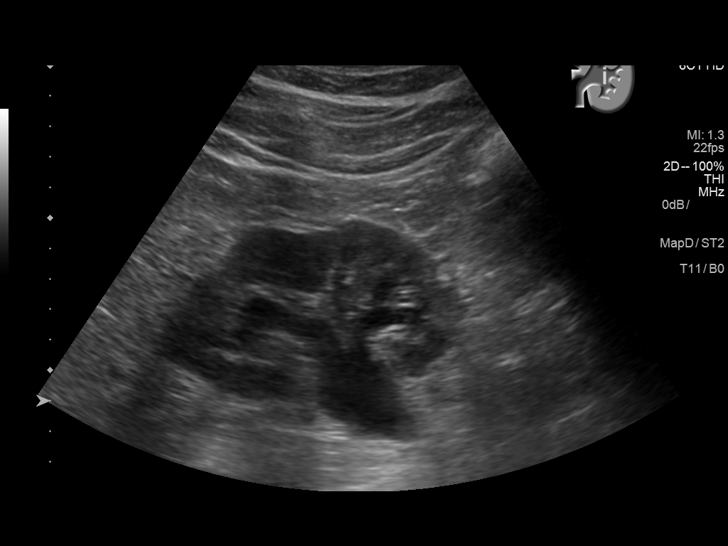
[im 26/48]
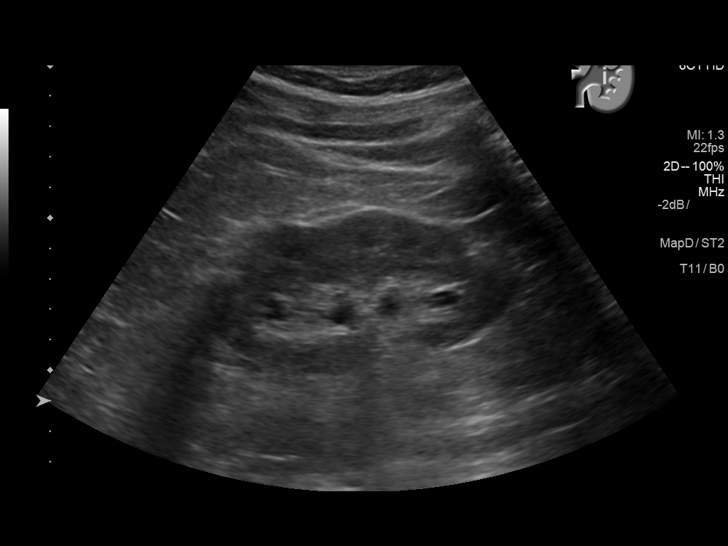
[im 30/48]
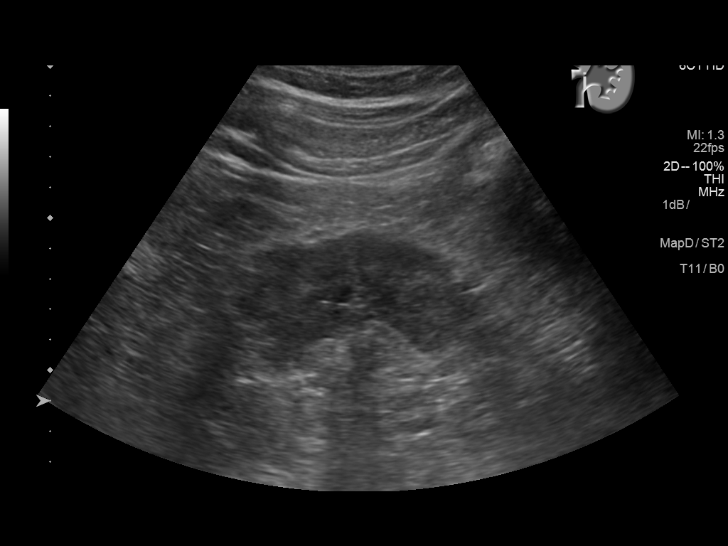
[im 32/48]
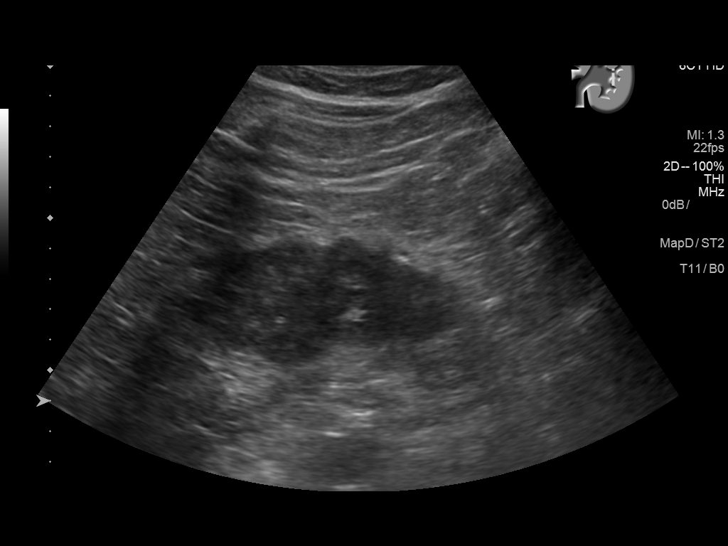
[im 36/48]
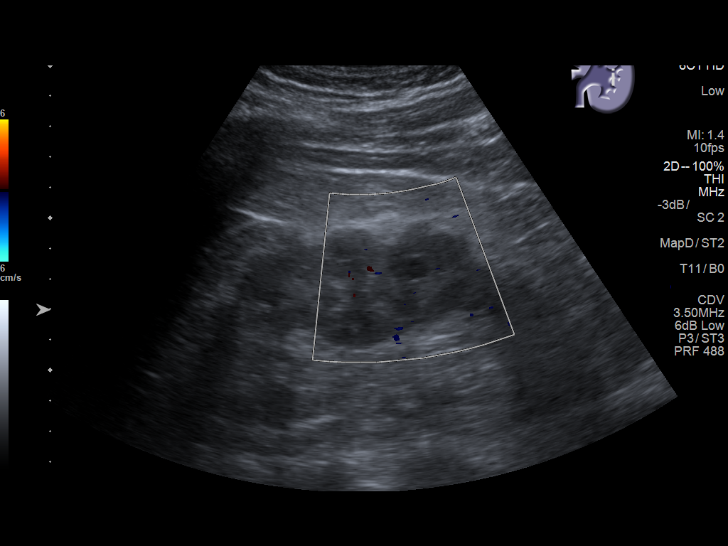
[im 40/48]
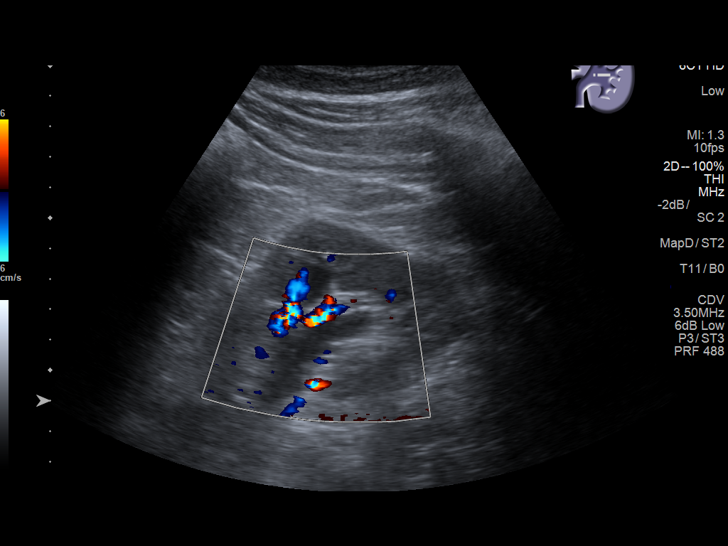
[im 44/48]
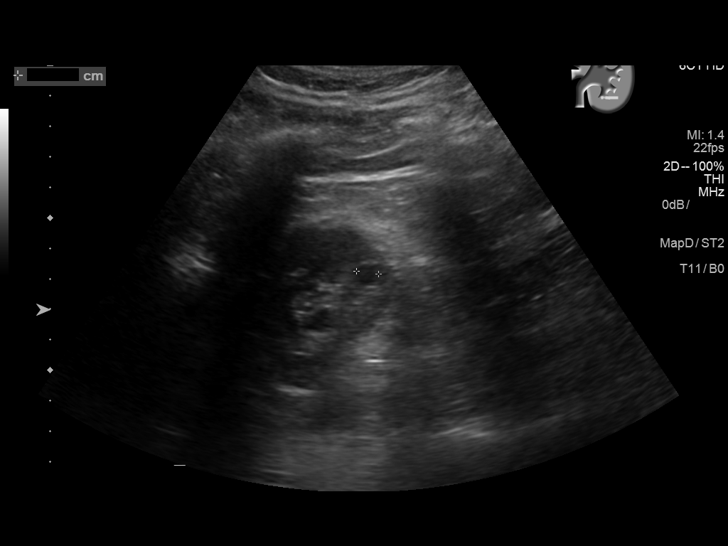
[im 48/48]
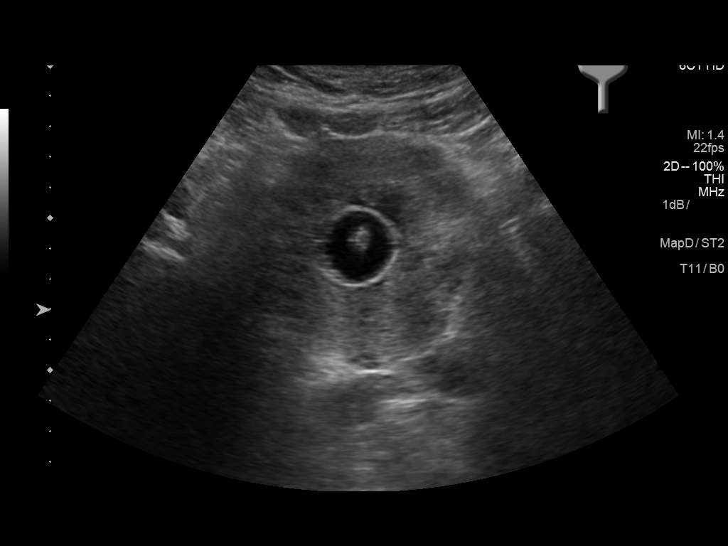

[14 of 25 positions shown; findings below may reference images not displayed]

FINDINGS: Right Kidney:

Renal measurements: 11.8 x 6.1 x 6.3 cm = volume: 239 mL. Normal
cortical thickness. Increased cortical echogenicity. Small cyst at
mid kidney laterally 9 x 10 x 9 mm. No additional mass,
hydronephrosis, or shadowing calcification.

Left Kidney:

Renal measurements: 9.7 x 6.3 x 5.5 cm = volume: 176 mL. Normal
cortical thickness. Increased cortical echogenicity. Moderate
hydronephrosis. Tiny cyst lateral kidney 9 x 8 x 7 mm. No additional
mass lesions or shadowing calculi.

Bladder:

Decompressed by Foley catheter. Unable to exclude bladder wall
thickening in this setting.
IMPRESSION: Medical renal disease changes of both kidneys.

Moderate LEFT hydronephrosis.

Tiny BILATERAL renal cysts.

## 2019-02-04 IMAGING — US US RENAL
1 series · 14 of 25 positions shown · non-contrast
Comparison: Ultrasound June 30, 2018.

CLINICAL DATA: Bilateral hydronephrosis.

EXAM:
RENAL / URINARY TRACT ULTRASOUND COMPLETE

[Series 1: us renal · 0.25mm/px · 14 of 47 slices shown]
[im 1/47]
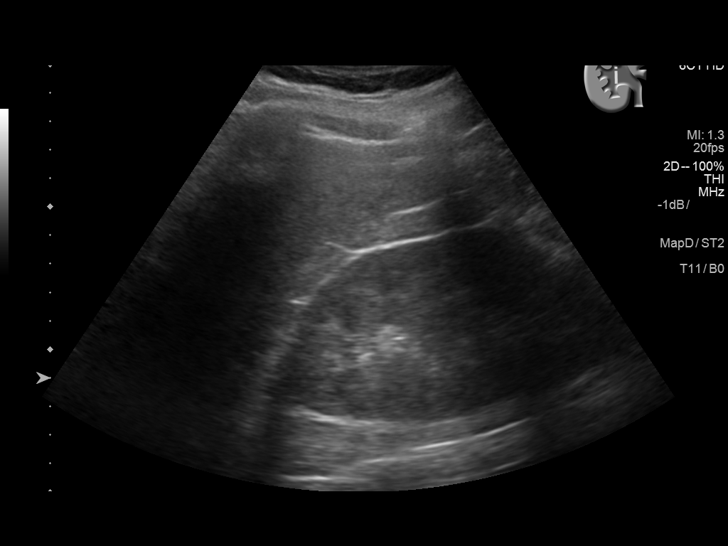
[im 4/47]
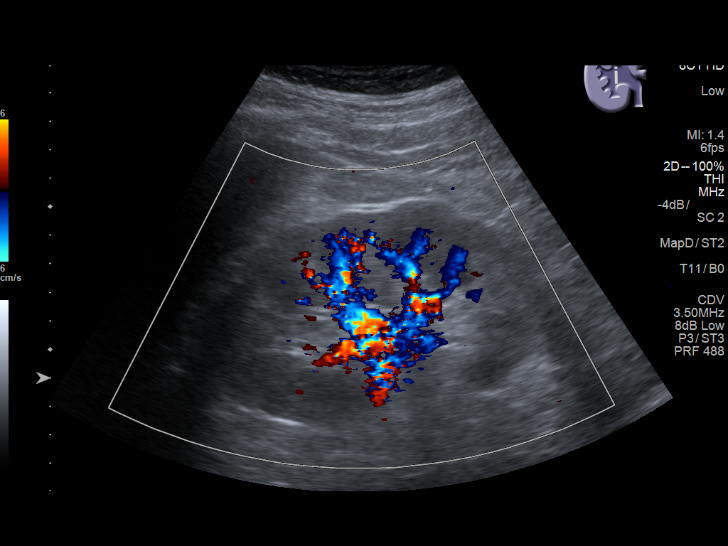
[im 8/47]
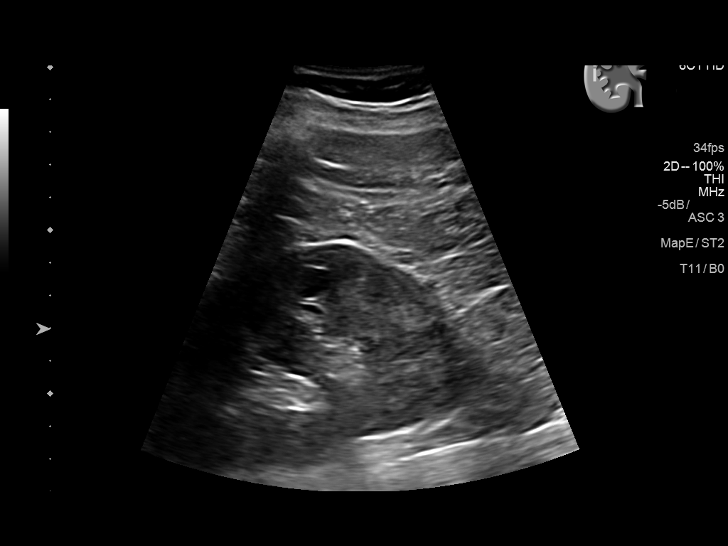
[im 12/47]
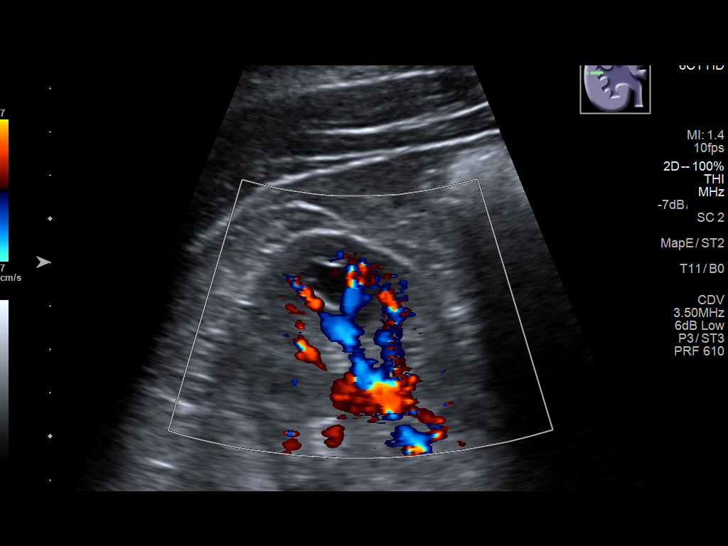
[im 16/47]
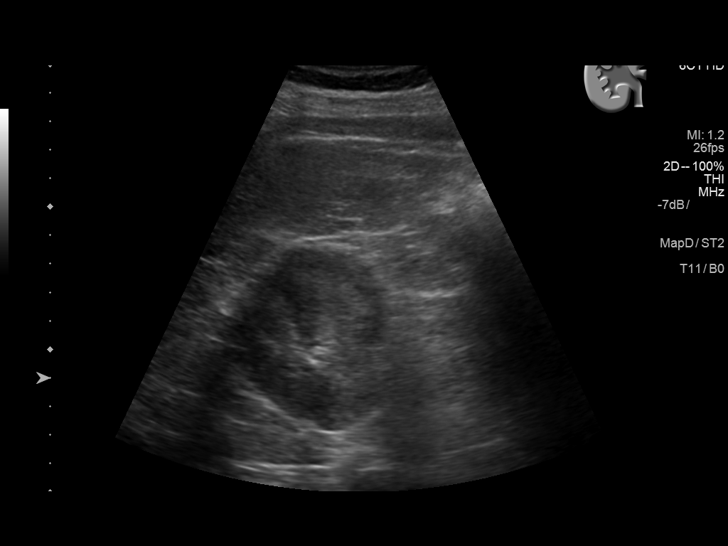
[im 18/47]
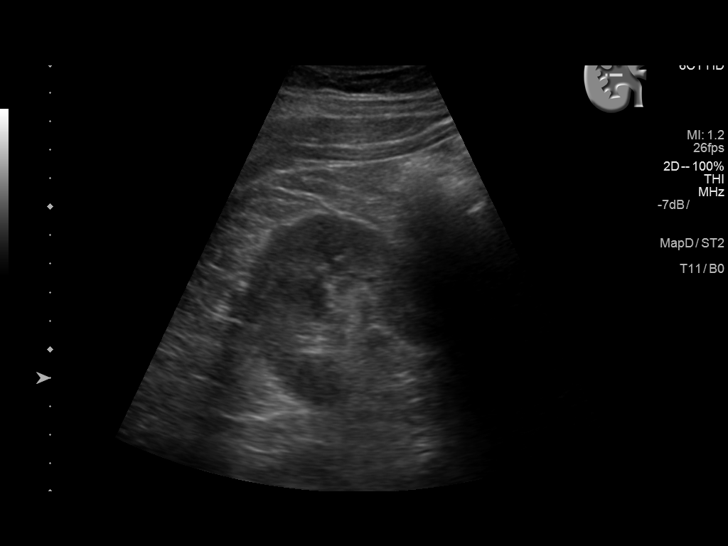
[im 22/47]
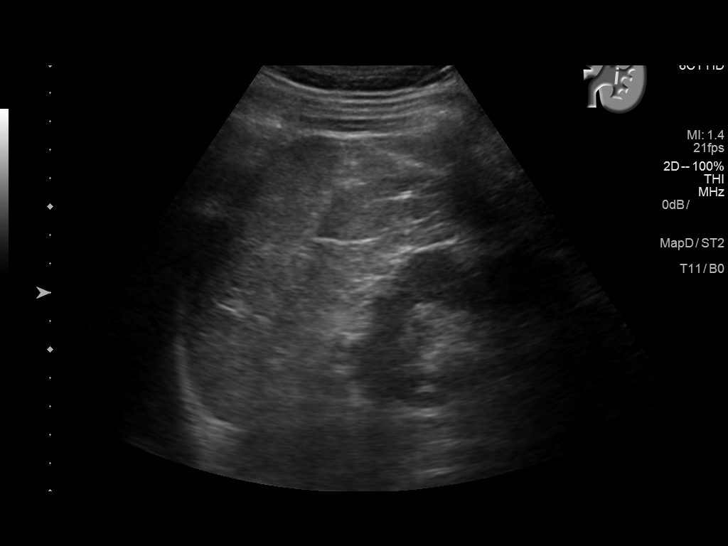
[im 25/47]
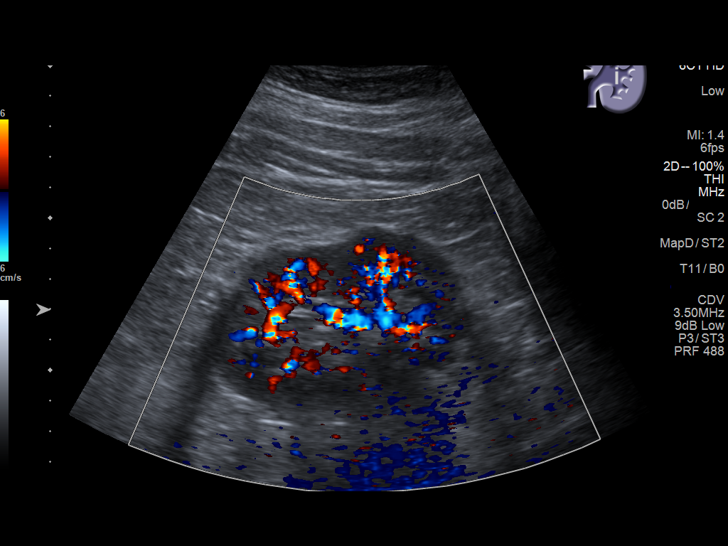
[im 29/47]
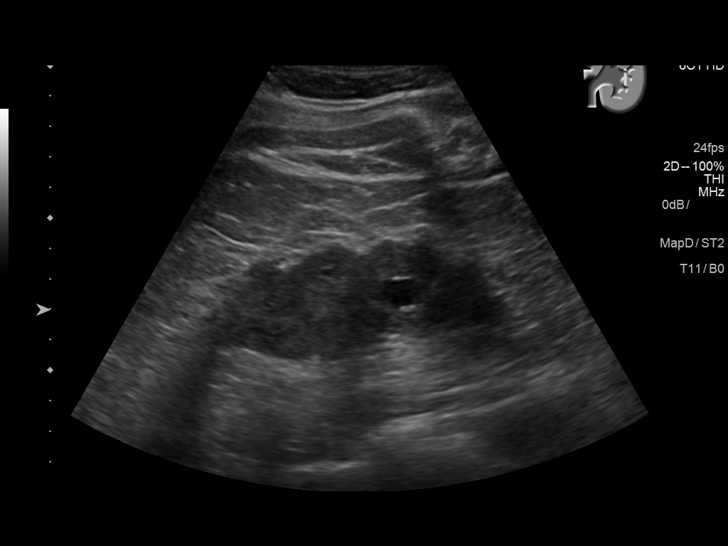
[im 31/47]
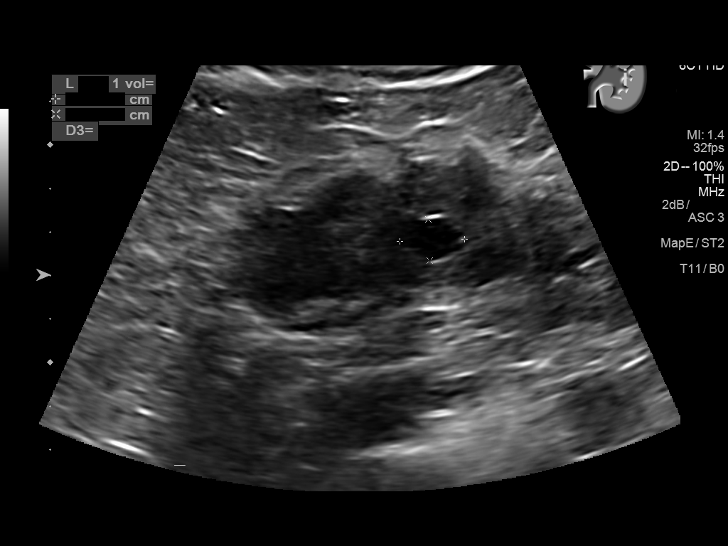
[im 35/47]
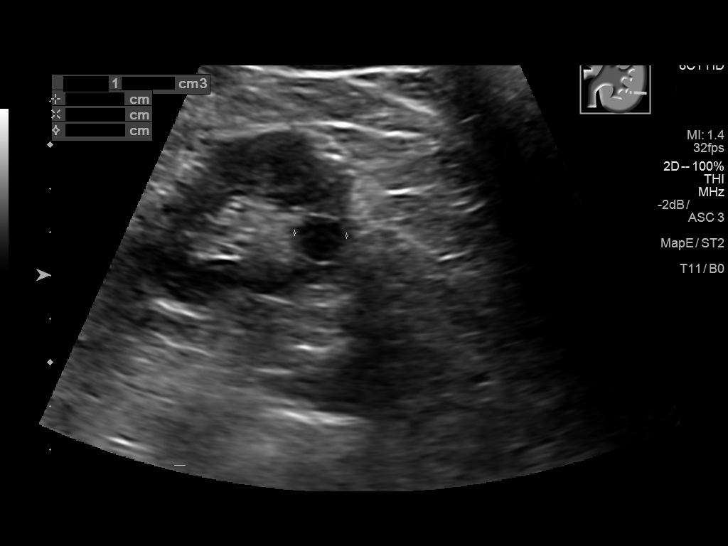
[im 39/47]
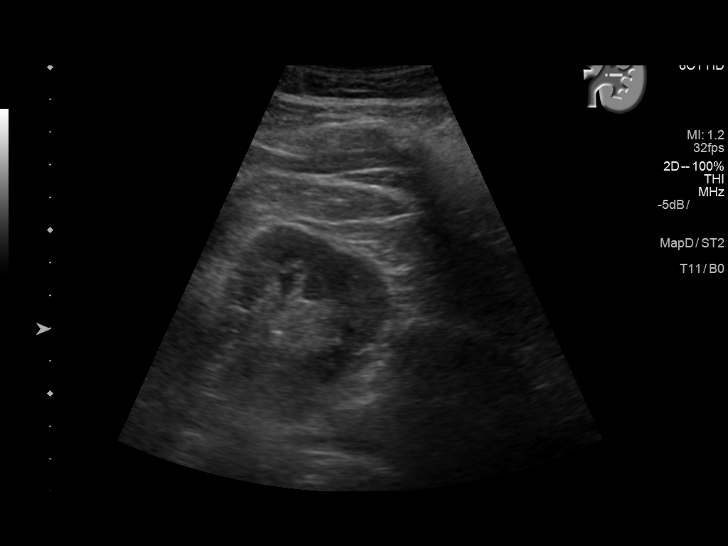
[im 43/47]
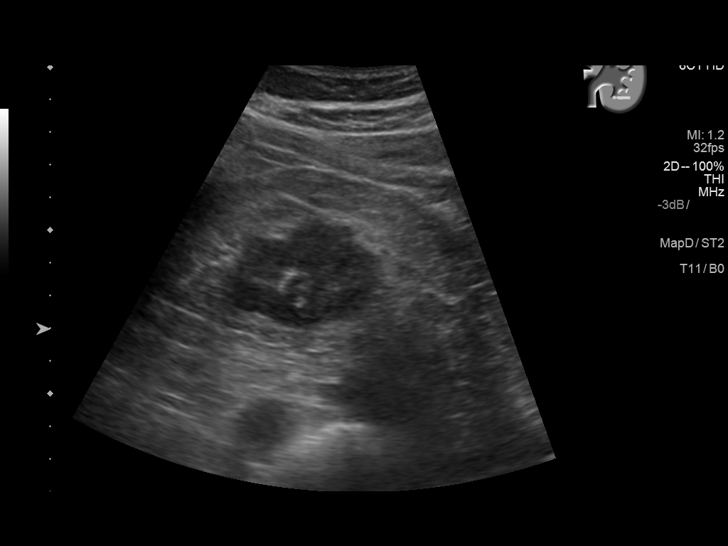
[im 47/47]
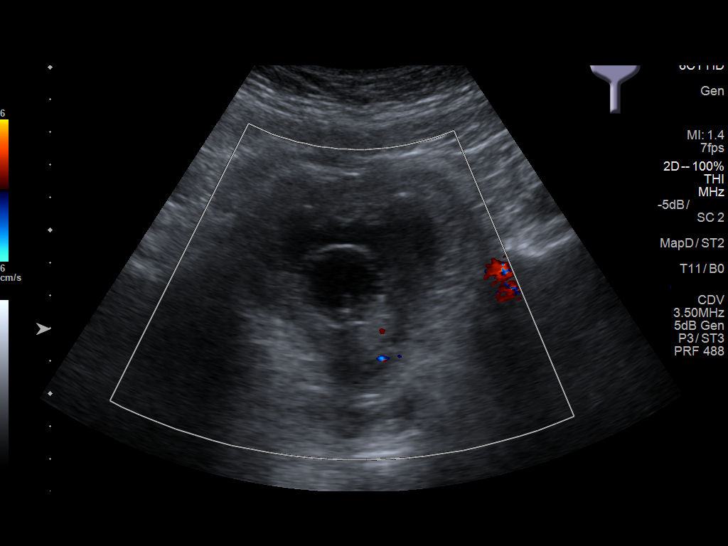

[14 of 25 positions shown; findings below may reference images not displayed]

FINDINGS: Right Kidney:

Renal measurements: 12.0 x 6.6 x 5.8 cm = volume: 238 mL. 1.2 cm
simple cyst is seen in midpole. Echogenicity within normal limits.
No mass or hydronephrosis visualized.

Left Kidney:

Renal measurements: 9.0 x 5.2 x 4.8 cm = volume: 117 mL. Lobulated
contours are noted. 1.5 cm simple cyst is seen in lower pole.
Echogenicity within normal limits. No mass or hydronephrosis
visualized.

Bladder:

Foley catheter is noted. Urinary bladder is only minimally
distended. Diffuse bladder wall thickening is noted which may
represent lack of distension, but cystitis can not be excluded.
IMPRESSION: Left hydronephrosis noted on prior exam has resolved. Lobulated left
renal cortex is noted suggesting scarring. Small bilateral simple
renal cysts are noted.

Foley catheter is noted within urinary bladder. Urinary bladder is
only minimally distended, and there is diffuse bladder wall
thickening which may be due to lack of distension, but cystitis can
not be excluded.

## 2019-03-24 ENCOUNTER — Other Ambulatory Visit: Payer: Self-pay | Admitting: *Deleted

## 2019-03-24 DIAGNOSIS — N138 Other obstructive and reflux uropathy: Secondary | ICD-10-CM

## 2019-03-25 ENCOUNTER — Other Ambulatory Visit: Payer: BC Managed Care – PPO

## 2019-03-25 ENCOUNTER — Other Ambulatory Visit: Payer: Self-pay

## 2019-03-25 DIAGNOSIS — N138 Other obstructive and reflux uropathy: Secondary | ICD-10-CM

## 2019-03-26 LAB — PSA: Prostate Specific Ag, Serum: 0.7 ng/mL (ref 0.0–4.0)

## 2019-04-01 ENCOUNTER — Encounter: Payer: Self-pay | Admitting: Urology

## 2019-04-01 ENCOUNTER — Other Ambulatory Visit: Payer: Self-pay

## 2019-04-01 ENCOUNTER — Ambulatory Visit (INDEPENDENT_AMBULATORY_CARE_PROVIDER_SITE_OTHER): Payer: BC Managed Care – PPO | Admitting: Urology

## 2019-04-01 VITALS — BP 144/87 | HR 98 | Ht 73.0 in | Wt 245.0 lb

## 2019-04-01 DIAGNOSIS — N401 Enlarged prostate with lower urinary tract symptoms: Secondary | ICD-10-CM

## 2019-04-01 DIAGNOSIS — N179 Acute kidney failure, unspecified: Secondary | ICD-10-CM | POA: Diagnosis not present

## 2019-04-01 DIAGNOSIS — N138 Other obstructive and reflux uropathy: Secondary | ICD-10-CM | POA: Diagnosis not present

## 2019-04-01 DIAGNOSIS — Z87898 Personal history of other specified conditions: Secondary | ICD-10-CM | POA: Diagnosis not present

## 2019-04-01 LAB — BLADDER SCAN AMB NON-IMAGING

## 2019-04-01 NOTE — Progress Notes (Signed)
04/01/2019 9:46 AM   Gregory Alexander 1966-04-09 [id number]  Referring provider: No referring provider defined for this encounter.  Chief Complaint  Patient presents with  . Benign Prostatic Hypertrophy    Follow up    HPI: 53 year old male with a personal history of BPH s/p holep 08/2018 returns today for follow-up.  He initially presented in overflow incontinence, bilateral hydronephrosis and acute kidney injury.  He required Foley catheter placement and a difficulty tolerating CIC.  He ultimately underwent holmium laser enucleation of the prostate.    His postoperative course complicated by UTI and epididymitis please had no issues since.  They, he has minimal voiding complaints.  He feels like his stream is as good as when he was 53 years old.  He feels like he empties well.  No dysuria.  He leaks very occasionally wears a safety pad for this but continues to improve.  This is not bothersome to him.  He is extremely pleased with the results.  He is no longer taking any BPH meds.  His creatinine has settled out about 1.3.  He continues to follow with a nephrologist and they are working on tight blood pressure control.  PSA 0.7 on 7/20.  IPSS    Row Name 04/01/19 0900         International Prostate Symptom Score   How often have you had the sensation of not emptying your bladder?  Not at All     How often have you had to urinate less than every two hours?  Not at All     How often have you found you stopped and started again several times when you urinated?  Not at All     How often have you found it difficult to postpone urination?  Not at All     How often have you had a weak urinary stream?  Not at All     How often have you had to strain to start urination?  Not at All     How many times did you typically get up at night to urinate?  1 Time     Total IPSS Score  1       Quality of Life due to urinary symptoms   If you were to spend the rest of your life with your  urinary condition just the way it is now how would you feel about that?  Delighted        Score:  1-7 Mild 8-19 Moderate 20-35 Severe   PMH: Past Medical History:  Diagnosis Date  . Chronic kidney disease    hydronephrosis  . Environmental and seasonal allergies   . Frequent nosebleeds 07/2018  . History of kidney stones    30 years ago  . Hypertension     Surgical History: Past Surgical History:  Procedure Laterality Date  . APPENDECTOMY    . HOLEP-LASER ENUCLEATION OF THE PROSTATE WITH MORCELLATION N/A 08/17/2018   Procedure: HOLEP-LASER ENUCLEATION OF THE PROSTATE WITH MORCELLATION;  Surgeon: Hollice Espy, MD;  Location: [facility] ORS;  Service: Urology;  Laterality: N/A;  . WISDOM TOOTH EXTRACTION      Home Medications:  Allergies as of 04/01/2019      Reactions   Dust Mite Extract Other (See Comments)   All year long, experiences sinus type symptoms      Medication List       Accurate as of April 01, 2019  9:46 AM. If you have any questions, ask your nurse or  doctor.        STOP taking these medications   sulfamethoxazole-trimethoprim 800-160 MG tablet Commonly known as: BACTRIM DS Stopped by: Hollice Espy, MD     TAKE these medications   amLODipine 5 MG tablet Commonly known as: NORVASC What changed: Another medication with the same name was removed. Continue taking this medication, and follow the directions you see here. Changed by: Hollice Espy, MD   atorvastatin 40 MG tablet Commonly known as: LIPITOR Take 40 mg by mouth daily.   Flonase Sensimist 27.5 MCG/SPRAY nasal spray Generic drug: fluticasone Place 2 sprays into the nose 2 (two) times daily.   Goodys Extra Strength 520-260-32.5 MG Pack Generic drug: Aspirin-Acetaminophen-Caffeine Take 1-2 packets by mouth 2 (two) times daily as needed (for pain or headache).   oxymetazoline 0.05 % nasal spray Commonly known as: AFRIN Place 1 spray into both nostrils 2 (two) times daily as needed  for congestion.       Allergies:  Allergies  Allergen Reactions  . Dust Mite Extract Other (See Comments)    All year long, experiences sinus type symptoms    Family History: Family History  Problem Relation Age of Onset  . Prostate cancer Father   . Kidney cancer Neg Hx     Social History:  reports that he has never smoked. He has never used smokeless tobacco. He reports current alcohol use. He reports that he does not use drugs.  ROS: UROLOGY Frequent Urination?: No Hard to postpone urination?: No Burning/pain with urination?: No Get up at night to urinate?: No Leakage of urine?: No Urine stream starts and stops?: No Trouble starting stream?: No Do you have to strain to urinate?: No Blood in urine?: No Urinary tract infection?: No Sexually transmitted disease?: No Injury to kidneys or bladder?: No Painful intercourse?: No Weak stream?: No Erection problems?: No Penile pain?: No  Gastrointestinal Nausea?: No Vomiting?: No Indigestion/heartburn?: No Diarrhea?: No Constipation?: No  Constitutional Fever: No Night sweats?: No Weight loss?: No Fatigue?: No  Skin Skin rash/lesions?: No Itching?: No  Eyes Blurred vision?: No Double vision?: No  Ears/Nose/Throat Sore throat?: No Sinus problems?: No  Hematologic/Lymphatic Swollen glands?: No Easy bruising?: No  Cardiovascular Leg swelling?: No Chest pain?: No  Respiratory Cough?: No Shortness of breath?: No  Endocrine Excessive thirst?: No  Musculoskeletal Back pain?: No Joint pain?: No  Neurological Headaches?: No Dizziness?: No  Psychologic Depression?: No Anxiety?: No  Physical Exam: BP (!) 144/87   Pulse 98   Ht 6\' 1"  (1.854 m)   Wt 245 lb (111.1 kg)   BMI 32.32 kg/m   Constitutional:  Alert and oriented, No acute distress. HEENT: [place] AT, moist mucus membranes.  Trachea midline, no masses. Cardiovascular: No clubbing, cyanosis, or edema. Respiratory: Normal respiratory  effort, no increased work of breathing. Skin: No rashes, bruises or suspicious lesions. Neurologic: Grossly intact, no focal deficits, moving all 4 extremities. Psychiatric: Normal mood and affect.  Laboratory Data: Gotten from care everywhere 03/02/2019 is 1.28  Pertinent Imaging: Results for orders placed or performed in visit on 04/01/19  Bladder Scan (Post Void Residual) in office  Result Value Ref Range   Scan Result 69ml     Assessment & Plan:    1. Benign prostatic hyperplasia with urinary obstruction Dramatic improvement in urinary symptoms/resolution of urinary retention following holmium laser enucleation of the prostate No BPH meds Minimal stress incontinence, improving PSA and new baseline 0.7 We will repeat PSA/DRE next year - Bladder Scan (Post  Void Residual) in office  2. History of urinary retention Emptying well today We will repeat bladder scan next year to ensure he continues to do well  3. AKI (acute kidney injury) ([place]) Continues to be followed by nephrology His creatinine continues to improve with GFR now back to normal range but creatinine not back down to his previous baseline New baseline 1.3   Return in about 1 year (around 03/31/2020) for IPSS/ PVR/ UA/ PSA/ DRE.  Hollice Espy, MD  [facility] [address] [place], [place] [address] [phone]

## 2020-04-11 ENCOUNTER — Ambulatory Visit: Payer: BC Managed Care – PPO | Admitting: Urology

## 2020-04-11 ENCOUNTER — Encounter: Payer: Self-pay | Admitting: Urology

## 2020-04-11 ENCOUNTER — Other Ambulatory Visit: Payer: Self-pay

## 2020-04-11 VITALS — BP 126/82 | HR 86 | Ht 73.0 in | Wt 250.0 lb

## 2020-04-11 DIAGNOSIS — N401 Enlarged prostate with lower urinary tract symptoms: Secondary | ICD-10-CM | POA: Diagnosis not present

## 2020-04-11 DIAGNOSIS — Z87898 Personal history of other specified conditions: Secondary | ICD-10-CM

## 2020-04-11 DIAGNOSIS — N138 Other obstructive and reflux uropathy: Secondary | ICD-10-CM

## 2020-04-11 LAB — MICROSCOPIC EXAMINATION
Bacteria, UA: NONE SEEN
Epithelial Cells (non renal): NONE SEEN /hpf (ref 0–10)

## 2020-04-11 LAB — URINALYSIS, COMPLETE
Bilirubin, UA: NEGATIVE
Glucose, UA: NEGATIVE
Ketones, UA: NEGATIVE
Leukocytes,UA: NEGATIVE
Nitrite, UA: NEGATIVE
Protein,UA: NEGATIVE
Specific Gravity, UA: 1.02 (ref 1.005–1.030)
Urobilinogen, Ur: 0.2 mg/dL (ref 0.2–1.0)
pH, UA: 6 (ref 5.0–7.5)

## 2020-04-11 LAB — BLADDER SCAN AMB NON-IMAGING: Scan Result: 21

## 2020-04-11 NOTE — Progress Notes (Signed)
04/11/2020 9:40 AM   Gregory Alexander [birth year] [id number]  Referring provider: No referring provider defined for this encounter.  Chief Complaint  Patient presents with  . Benign Prostatic Hypertrophy    HPI: 54 year old male with a history of BPH, massive urinary retention, bilateral hydroureteronephrosis now status post holep who returns today for routine annual follow-up.  He again today is minimal voiding complaints.  IPSS as below.  His new baseline creatinine of 1.3 remained stable as of 10/20/2019.  PSA is due today and is pending.  PVR initially 221, asked to void and empty bladder and repeat was 21 cc.   IPSS    Row Name 04/11/20 0900         International Prostate Symptom Score   How often have you had the sensation of not emptying your bladder? Not at All     How often have you had to urinate less than every two hours? Not at All     How often have you found you stopped and started again several times when you urinated? Not at All     How often have you found it difficult to postpone urination? Not at All     How often have you had a weak urinary stream? Not at All     How often have you had to strain to start urination? Not at All     How many times did you typically get up at night to urinate? 1 Time     Total IPSS Score 1       Quality of Life due to urinary symptoms   If you were to spend the rest of your life with your urinary condition just the way it is now how would you feel about that? Mostly Satisfied            Score:  1-7 Mild 8-19 Moderate 20-35 Severe   PMH: Past Medical History:  Diagnosis Date  . Chronic kidney disease    hydronephrosis  . Environmental and seasonal allergies   . Frequent nosebleeds 07/2018  . History of kidney stones    30 years ago  . Hypertension     Surgical History: Past Surgical History:  Procedure Laterality Date  . APPENDECTOMY    . HOLEP-LASER ENUCLEATION OF THE PROSTATE WITH MORCELLATION N/A  08/17/2018   Procedure: HOLEP-LASER ENUCLEATION OF THE PROSTATE WITH MORCELLATION;  Surgeon: Hollice Espy, MD;  Location: [facility];  Service: Urology;  Laterality: N/A;  . WISDOM TOOTH EXTRACTION      Home Medications:  Allergies as of 04/11/2020      Reactions   Dust Mite Extract Other (See Comments)   All year long, experiences sinus type symptoms      Medication List       Accurate as of April 11, 2020  9:40 AM. If you have any questions, ask your nurse or doctor.        amLODipine 5 MG tablet Commonly known as: NORVASC   atorvastatin 40 MG tablet Commonly known as: LIPITOR Take 40 mg by mouth daily.   Flonase Sensimist 27.5 MCG/SPRAY nasal spray Generic drug: fluticasone Place 2 sprays into the nose 2 (two) times daily.   Goodys Extra Strength 520-260-32.5 MG Pack Generic drug: Aspirin-Acetaminophen-Caffeine Take 1-2 packets by mouth 2 (two) times daily as needed (for pain or headache).   oxymetazoline 0.05 % nasal spray Commonly known as: AFRIN Place 1 spray into both nostrils 2 (two) times daily as needed for congestion.  Allergies:  Allergies  Allergen Reactions  . Dust Mite Extract Other (See Comments)    All year long, experiences sinus type symptoms    Family History: Family History  Problem Relation Age of Onset  . Prostate cancer Father   . Kidney cancer Neg Hx     Social History:  reports that he has never smoked. He has never used smokeless tobacco. He reports current alcohol use. He reports that he does not use drugs.   Physical Exam: BP 126/82   Pulse 86   Ht 6\' 1"  (1.854 m)   Wt 250 lb (113.4 kg)   BMI 32.98 kg/m   Constitutional:  Alert and oriented, No acute distress. HEENT: [name], moist mucus membranes.  Trachea midline, no masses. Cardiovascular: No clubbing, cyanosis, or edema. Respiratory: Normal respiratory effort, no increased work of breathing. GI: Abdomen is soft, nontender, nondistended, no abdominal  masses Rectal: Smooth, 40 g, nontender, no nodules Skin: No rashes, bruises or suspicious lesions. Neurologic: Grossly intact, no focal deficits, moving all 4 extremities. Psychiatric: Normal mood and affect.  Laboratory Data: AS above  Urinalysis reviewed, negative  Assessment & Plan:    1. Benign prostatic hyperplasia with urinary obstruction Doing extremely well status post holep Creatinine remains stable Minimal to no urinary symptoms PSA screening today, repeat PSA is pending Given his history, will continue to follow him annually - PSA; Future - Urinalysis, Complete - PSA  2. History of urinary retention Excellent emptying today - BLADDER SCAN AMB NON-IMAGING  F/u 1 year with PSA/ DRE/ IPSS/ PVR  Hollice Espy, MD  [facility] [address], [place], [place] [address] [phone]

## 2020-04-12 ENCOUNTER — Telehealth: Payer: Self-pay

## 2020-04-12 LAB — PSA: Prostate Specific Ag, Serum: 0.7 ng/mL (ref 0.0–4.0)

## 2020-04-12 NOTE — Telephone Encounter (Signed)
Pt aware of results 

## 2020-04-12 NOTE — Telephone Encounter (Signed)
-----   Message from Hollice Espy, MD sent at 04/12/2020  8:41 AM EDT ----- PSA stable at 0.7, great news!

## 2021-04-17 ENCOUNTER — Ambulatory Visit: Payer: Self-pay | Admitting: Urology

## 2021-04-17 NOTE — Progress Notes (Signed)
04/18/21 1:05 PM   Gregory Alexander 01/18/1966 [id number]  Referring provider:  No referring provider defined for this encounter. Chief Complaint  Patient presents with   Benign Prostatic Hypertrophy     HPI: Gregory Alexander is a 55 y.o.male with a personal history of BPH, massive urinary retention, and bilateral hydroureteronephrosis,  who returns today for 1 year follow-up with IPSS, Urinalysis, PVR, DRE, and PSA.   He is s/p HoLEP on [id number] which was complicated  postoperatively by UTI and epididymitis.   Recent PSA on 04/11/2020 was 0.7.   He is doing well today.  He has minimal to no urinary symptoms outlined below.  He is now being followed by nephrology.  His most recent creatinine was 1.2 which is improved.  He is working on weight loss.  PSA Trend:  Component Prostate Specific Ag, Serum  Latest Ref Rng & Units 0.0 - 4.0 ng/mL  03/25/2019 0.7  04/11/2020 0.7     IPSS     Row Name 04/18/21 0900         International Prostate Symptom Score   How often have you had the sensation of not emptying your bladder? Not at All     How often have you had to urinate less than every two hours? Not at All     How often have you found you stopped and started again several times when you urinated? Not at All     How often have you found it difficult to postpone urination? Not at All     How often have you had a weak urinary stream? Not at All     How often have you had to strain to start urination? Not at All     How many times did you typically get up at night to urinate? 1 Time     Total IPSS Score 1           Quality of Life due to urinary symptoms   If you were to spend the rest of your life with your urinary condition just the way it is now how would you feel about that? Pleased              Score:  1-7 Mild 8-19 Moderate 20-35 Severe   PMH: Past Medical History:  Diagnosis Date   Chronic kidney disease    hydronephrosis   Environmental and seasonal  allergies    Frequent nosebleeds 07/2018   History of kidney stones    30 years ago   Hypertension     Surgical History: Past Surgical History:  Procedure Laterality Date   APPENDECTOMY     HOLEP-LASER ENUCLEATION OF THE PROSTATE WITH MORCELLATION N/A 08/17/2018   Procedure: HOLEP-LASER ENUCLEATION OF THE PROSTATE WITH MORCELLATION;  Surgeon: Hollice Espy, MD;  Location: [facility];  Service: Urology;  Laterality: N/A;   WISDOM TOOTH EXTRACTION      Home Medications:  Allergies as of 04/18/2021       Reactions   Dust Mite Extract Other (See Comments)   All year long, experiences sinus type symptoms        Medication List        Accurate as of April 18, 2021  1:05 PM. If you have any questions, ask your nurse or doctor.          STOP taking these medications    fluticasone 27.5 MCG/SPRAY nasal spray Commonly known as: VERAMYST Stopped by: Hollice Espy, MD   Goodys Extra Strength [phone]  MG Pack Generic drug: Aspirin-Acetaminophen-Caffeine Stopped by: Hollice Espy, MD   oxymetazoline 0.05 % nasal spray Commonly known as: AFRIN Stopped by: Hollice Espy, MD       TAKE these medications    amLODipine 10 MG tablet Commonly known as: NORVASC Take 10 mg by mouth daily. What changed: Another medication with the same name was removed. Continue taking this medication, and follow the directions you see here. Changed by: Hollice Espy, MD   aspirin 81 MG EC tablet Take by mouth.   atorvastatin 40 MG tablet Commonly known as: LIPITOR Take 40 mg by mouth daily.        Allergies:  Allergies  Allergen Reactions   Dust Mite Extract Other (See Comments)    All year long, experiences sinus type symptoms    Family History: Family History  Problem Relation Age of Onset   Prostate cancer Father    Kidney cancer Neg Hx     Social History:  reports that he has never smoked. He has never used smokeless tobacco. He reports current alcohol use. He  reports that he does not use drugs.   Physical Exam: BP 126/88   Pulse 86   Ht '6\' 1"'$  (1.854 m)   Wt 247 lb (112 kg)   BMI 32.59 kg/m   Constitutional:  Alert and oriented, No acute distress. HEENT: [place] AT, moist mucus membranes.  Trachea midline, no masses. Cardiovascular: No clubbing, cyanosis, or edema. Respiratory: Normal respiratory effort, no increased work of breathing. Rectal: Normal sphincter tone,  40  CC prostate, no tender, no masses, smooth no nodules Skin: No rashes, bruises or suspicious lesions. Neurologic: Grossly intact, no focal deficits, moving all 4 extremities. Psychiatric: Normal mood and affect.  Laboratory Data:  Lab Results  Component Value Date   CREATININE 1.50 (H) 08/03/2018     Pertinent Imaging: Results for orders placed or performed in visit on 04/18/21  BLADDER SCAN AMB NON-IMAGING  Result Value Ref Range   Scan Result 50 ml      Assessment & Plan:    BPH with urinary obstruction  - S/p HoLEP doing well  - PSA screening today is pending  - Creatinine remains stable , now being followed by nephrology of which the notes were reviewed - IPSS is stable today / excellent control on no BPH meds -We discussed warning symptoms for worsening urinary symptoms, signs and symptoms of urinary retention or incomplete bladder emptying  -As long as his urinary symptoms remain stable and his creatinine is also in the same range, we will see him as needed.  If his creatinine worsens, recommend return for PVR/reassessment to ensure that he is not developed recurrent bladder outlet obstruction  History of urinary retention  - Bladder scan minimal - Emptying adequately today   Return if symptoms worsen or fail to improve.    Conley Rolls as a Education administrator for Hollice Espy, MD.,have documented all relevant documentation on the behalf of Hollice Espy, MD,as directed by  Hollice Espy, MD while in the presence of Hollice Espy, MD.  I have  reviewed the above documentation for accuracy and completeness, and I agree with the above.   Hollice Espy, MD   [facility] [address], [place], [place] [address] [phone]

## 2021-04-18 ENCOUNTER — Ambulatory Visit (INDEPENDENT_AMBULATORY_CARE_PROVIDER_SITE_OTHER): Payer: BC Managed Care – PPO | Admitting: Urology

## 2021-04-18 ENCOUNTER — Encounter: Payer: Self-pay | Admitting: Urology

## 2021-04-18 ENCOUNTER — Other Ambulatory Visit: Payer: Self-pay

## 2021-04-18 VITALS — BP 126/88 | HR 86 | Ht 73.0 in | Wt 247.0 lb

## 2021-04-18 DIAGNOSIS — Z87898 Personal history of other specified conditions: Secondary | ICD-10-CM | POA: Diagnosis not present

## 2021-04-18 DIAGNOSIS — N138 Other obstructive and reflux uropathy: Secondary | ICD-10-CM

## 2021-04-18 DIAGNOSIS — N401 Enlarged prostate with lower urinary tract symptoms: Secondary | ICD-10-CM

## 2021-04-18 LAB — BLADDER SCAN AMB NON-IMAGING: Scan Result: 50

## 2021-04-19 ENCOUNTER — Telehealth: Payer: Self-pay | Admitting: *Deleted

## 2021-04-19 LAB — URINALYSIS, COMPLETE
Bilirubin, UA: NEGATIVE
Glucose, UA: NEGATIVE
Ketones, UA: NEGATIVE
Leukocytes,UA: NEGATIVE
Nitrite, UA: NEGATIVE
Protein,UA: NEGATIVE
Specific Gravity, UA: 1.02 (ref 1.005–1.030)
Urobilinogen, Ur: 0.2 mg/dL (ref 0.2–1.0)
pH, UA: 6 (ref 5.0–7.5)

## 2021-04-19 LAB — MICROSCOPIC EXAMINATION

## 2021-04-19 LAB — PSA: Prostate Specific Ag, Serum: 0.8 ng/mL (ref 0.0–4.0)

## 2021-04-19 NOTE — Telephone Encounter (Addendum)
Patient advised, voiced understanding.  ----- Message from Hollice Espy, MD sent at 04/19/2021  8:04 AM EDT ----- PSA is excellent, low  Hollice Espy, MD
